# Patient Record
Sex: Male | Born: 1960 | Race: White | Hispanic: No | Marital: Married | Smoking: Current every day smoker
Health system: Southern US, Community
[De-identification: ages and names within clinical notes are randomized; demographics above are authoritative.]

## PROBLEM LIST (undated history)

## (undated) DIAGNOSIS — E785 Hyperlipidemia, unspecified: Secondary | ICD-10-CM

## (undated) DIAGNOSIS — R059 Cough, unspecified: Secondary | ICD-10-CM

## (undated) DIAGNOSIS — H9190 Unspecified hearing loss, unspecified ear: Secondary | ICD-10-CM

## (undated) DIAGNOSIS — G4730 Sleep apnea, unspecified: Secondary | ICD-10-CM

## (undated) DIAGNOSIS — R42 Dizziness and giddiness: Secondary | ICD-10-CM

## (undated) DIAGNOSIS — G709 Myoneural disorder, unspecified: Secondary | ICD-10-CM

## (undated) DIAGNOSIS — E119 Type 2 diabetes mellitus without complications: Secondary | ICD-10-CM

## (undated) DIAGNOSIS — M1990 Unspecified osteoarthritis, unspecified site: Secondary | ICD-10-CM

## (undated) DIAGNOSIS — R05 Cough: Secondary | ICD-10-CM

## (undated) HISTORY — DX: Sleep apnea, unspecified: G47.30

## (undated) HISTORY — DX: Type 2 diabetes mellitus without complications: E11.9

## (undated) HISTORY — DX: Hyperlipidemia, unspecified: E78.5

---

## 1995-10-30 HISTORY — PX: PILONIDAL CYST EXCISION: SHX744

## 2002-09-19 ENCOUNTER — Inpatient Hospital Stay (HOSPITAL_COMMUNITY): Admission: EM | Admit: 2002-09-19 | Discharge: 2002-09-20 | Payer: Self-pay | Admitting: Emergency Medicine

## 2011-07-10 ENCOUNTER — Ambulatory Visit: Payer: Self-pay | Admitting: Unknown Physician Specialty

## 2011-08-16 ENCOUNTER — Ambulatory Visit: Payer: Self-pay | Admitting: Anesthesiology

## 2011-08-22 ENCOUNTER — Ambulatory Visit: Payer: Self-pay | Admitting: Unknown Physician Specialty

## 2011-08-22 HISTORY — PX: SHOULDER SURGERY: SHX246

## 2011-10-30 HISTORY — PX: APPENDECTOMY: SHX54

## 2015-04-21 ENCOUNTER — Encounter: Payer: Self-pay | Admitting: Family Medicine

## 2015-04-21 ENCOUNTER — Ambulatory Visit (INDEPENDENT_AMBULATORY_CARE_PROVIDER_SITE_OTHER): Payer: BC Managed Care – PPO | Admitting: Family Medicine

## 2015-04-21 VITALS — BP 122/80 | HR 74 | Temp 98.1°F | Ht 69.7 in | Wt 222.0 lb

## 2015-04-21 DIAGNOSIS — S90424A Blister (nonthermal), right lesser toe(s), initial encounter: Secondary | ICD-10-CM | POA: Diagnosis not present

## 2015-04-21 DIAGNOSIS — E1140 Type 2 diabetes mellitus with diabetic neuropathy, unspecified: Secondary | ICD-10-CM | POA: Diagnosis not present

## 2015-04-21 DIAGNOSIS — E785 Hyperlipidemia, unspecified: Secondary | ICD-10-CM | POA: Insufficient documentation

## 2015-04-21 LAB — BAYER DCA HB A1C WAIVED: HB A1C (BAYER DCA - WAIVED): 6.2 % (ref <7.0)

## 2015-04-21 MED ORDER — METFORMIN HCL 1000 MG PO TABS: 1000.0000 mg | ORAL_TABLET | Freq: Two times a day (BID) | ORAL | Status: DC

## 2015-04-21 MED ORDER — DAPAGLIFLOZIN PROPANEDIOL 10 MG PO TABS: 10.0000 mg | ORAL_TABLET | Freq: Every day | ORAL | Status: DC

## 2015-04-21 NOTE — Progress Notes (Signed)
   BP 122/80 mmHg  Pulse 74  Temp(Src) 98.1 F (36.7 C)  Ht 5' 9.7" (1.77 m)  Wt 222 lb (100.699 kg)  BMI 32.14 kg/m2  SpO2 96%   Subjective:    Patient ID: Gerald Villegas, male    DOB: 1960/11/03, 54 y.o.   MRN: [id number]  HPI: Gerald Villegas is a 55 y.o. male  Chief Complaint  Patient presents with  . Diabetes  doing much better and 8lb wt loss No side effects from meds farxiga doing well  Concerned with got blister on foot which is healing well with new shoes  Also a gamglion rt 5th toe  Relevant past medical, surgical, family and social history reviewed and updated as indicated. Interim medical history since our last visit reviewed. Allergies and medications reviewed and updated.  Review of Systems  Constitutional: Negative.   Respiratory: Negative.   Cardiovascular: Negative.     Per HPI unless specifically indicated above     Objective:    BP 122/80 mmHg  Pulse 74  Temp(Src) 98.1 F (36.7 C)  Ht 5' 9.7" (1.77 m)  Wt 222 lb (100.699 kg)  BMI 32.14 kg/m2  SpO2 96%  Wt Readings from Last 3 Encounters:  04/21/15 222 lb (100.699 kg)  01/27/15 230 lb (104.327 kg)    Physical Exam  Constitutional: He is oriented to person, place, and time. He appears well-developed and well-nourished. No distress.  HENT:  Head: Normocephalic and atraumatic.  Right Ear: Hearing normal.  Left Ear: Hearing normal.  Nose: Nose normal.  Eyes: Conjunctivae and lids are normal. Right eye exhibits no discharge. Left eye exhibits no discharge. No scleral icterus.  Pulmonary/Chest: Effort normal. No respiratory distress.  Musculoskeletal: Normal range of motion.  Feet clear resolving blister Ganglion stable Feet otherwise stable  Neurological: He is alert and oriented to person, place, and time.  Skin: Skin is intact. No rash noted.  Psychiatric: He has a normal mood and affect. His speech is normal and behavior is normal. Judgment and thought content normal. Cognition and  memory are normal.    No results found for this or any previous visit.    Assessment & Plan:   Problem List Items Addressed This Visit      Endocrine   Type 2 diabetes mellitus with diabetic neuropathy - Primary    The current medical regimen is effective;  continue present plan and medications. Much better with farxiga      Relevant Medications   dapagliflozin propanediol (FARXIGA) 10 MG TABS tablet   metFORMIN (GLUCOPHAGE) 1000 MG tablet   Other Relevant Orders   Bayer DCA Hb A1c Waived    Other Visit Diagnoses    Blister of toe of right foot, initial encounter        stable with no infection        Follow up plan: Return in about 3 months (around 07/22/2015) for a1c in 3 mo.

## 2015-04-21 NOTE — Assessment & Plan Note (Signed)
The current medical regimen is effective;  continue present plan and medications. Much better with farxiga

## 2015-07-27 ENCOUNTER — Encounter: Payer: Self-pay | Admitting: Family Medicine

## 2015-07-27 ENCOUNTER — Ambulatory Visit (INDEPENDENT_AMBULATORY_CARE_PROVIDER_SITE_OTHER): Payer: BC Managed Care – PPO | Admitting: Family Medicine

## 2015-07-27 VITALS — BP 128/68 | HR 56 | Temp 97.5°F | Ht 68.25 in | Wt 222.4 lb

## 2015-07-27 DIAGNOSIS — Z23 Encounter for immunization: Secondary | ICD-10-CM | POA: Diagnosis not present

## 2015-07-27 DIAGNOSIS — G4730 Sleep apnea, unspecified: Secondary | ICD-10-CM | POA: Diagnosis not present

## 2015-07-27 DIAGNOSIS — E785 Hyperlipidemia, unspecified: Secondary | ICD-10-CM | POA: Diagnosis not present

## 2015-07-27 DIAGNOSIS — E1140 Type 2 diabetes mellitus with diabetic neuropathy, unspecified: Secondary | ICD-10-CM | POA: Diagnosis not present

## 2015-07-27 DIAGNOSIS — E119 Type 2 diabetes mellitus without complications: Secondary | ICD-10-CM

## 2015-07-27 NOTE — Assessment & Plan Note (Signed)
The current medical regimen is effective;  continue present plan and medications.  

## 2015-07-27 NOTE — Assessment & Plan Note (Signed)
Discussed dietary treatment and weight loss

## 2015-07-27 NOTE — Progress Notes (Signed)
BP 128/68 mmHg  Pulse 56  Temp(Src) 97.5 F (36.4 C)  Ht 5' 8.25" (1.734 m)  Wt 222 lb 6.4 oz (100.88 kg)  BMI 33.55 kg/m2  SpO2 96%   Subjective:    Patient ID: Gerald Villegas, male    DOB: Jan 13, 1961, 54 y.o.   MRN: [id number]  HPI: Gerald Villegas is a 54 y.o. male  Chief Complaint  Patient presents with  . Follow-up   Patient doing well no complaints from medication no low blood sugar spells takes medicines every day without problems. Patient does relate an episode that sounds like low blood sugar Reviewed nutrition has limited breakfast  Patient with marked afternoon fatigue patient uses his BiPAP faithfully Takes an afternoon nap most afternoons and wakes up refreshed after about 30 minutes. On review patient getting 5-6 hours of sleep at night. Also limited nutrition in the morning.  Relevant past medical, surgical, family and social history reviewed and updated as indicated. Interim medical history since our last visit reviewed. Allergies and medications reviewed and updated.  Review of Systems  Constitutional: Negative.   Respiratory: Negative.   Cardiovascular: Negative.     Per HPI unless specifically indicated above     Objective:    BP 128/68 mmHg  Pulse 56  Temp(Src) 97.5 F (36.4 C)  Ht 5' 8.25" (1.734 m)  Wt 222 lb 6.4 oz (100.88 kg)  BMI 33.55 kg/m2  SpO2 96%  Wt Readings from Last 3 Encounters:  07/27/15 222 lb 6.4 oz (100.88 kg)  04/21/15 222 lb (100.699 kg)  01/27/15 230 lb (104.327 kg)    Physical Exam  Constitutional: He is oriented to person, place, and time. He appears well-developed and well-nourished. No distress.  HENT:  Head: Normocephalic and atraumatic.  Right Ear: Hearing normal.  Left Ear: Hearing normal.  Nose: Nose normal.  Eyes: Conjunctivae and lids are normal. Right eye exhibits no discharge. Left eye exhibits no discharge. No scleral icterus.  Cardiovascular: Normal rate, regular rhythm and normal heart sounds.    Pulmonary/Chest: Effort normal and breath sounds normal. No respiratory distress.  Musculoskeletal: Normal range of motion.  Neurological: He is alert and oriented to person, place, and time.  Skin: Skin is intact. No rash noted.  Psychiatric: He has a normal mood and affect. His speech is normal and behavior is normal. Judgment and thought content normal. Cognition and memory are normal.    Results for orders placed or performed in visit on 04/21/15  Bayer DCA Hb A1c Waived  Result Value Ref Range   Bayer DCA Hb A1c Waived 6.2 <7.0 %      Assessment & Plan:   Problem List Items Addressed This Visit      Endocrine   Type 2 diabetes mellitus with diabetic neuropathy    The current medical regimen is effective;  continue present plan and medications.         Other   Hyperlipidemia    Discussed dietary treatment and weight loss      Sleep apnea    Discussed sleep hygiene for fatigue continual use of BiPAP More sleep hours Also to enjoy occasional nap.       Other Visit Diagnoses    Type 2 diabetes mellitus without complication    -  Primary    Relevant Orders    Glucose Hemocue Waived (STAT)    Bayer DCA Hb A1c Waived    Need for influenza vaccination  Relevant Orders    Flu Vaccine QUAD 36+ mos PF IM (Fluarix & Fluzone Quad PF) (Completed)        Follow up plan: Return in about 3 months (around 10/26/2015) for Physical Exam and a1c.

## 2015-07-27 NOTE — Assessment & Plan Note (Signed)
Discussed sleep hygiene for fatigue continual use of BiPAP More sleep hours Also to enjoy occasional nap.

## 2015-07-28 LAB — BAYER DCA HB A1C WAIVED: HB A1C: 6.4 % (ref <7.0)

## 2015-07-28 LAB — GLUCOSE HEMOCUE WAIVED: Glu Hemocue Waived: 173 mg/dL — ABNORMAL HIGH (ref 65–99)

## 2015-10-31 LAB — HM DIABETES EYE EXAM

## 2015-11-03 ENCOUNTER — Encounter: Payer: Self-pay | Admitting: Family Medicine

## 2015-11-03 ENCOUNTER — Ambulatory Visit (INDEPENDENT_AMBULATORY_CARE_PROVIDER_SITE_OTHER): Payer: BC Managed Care – PPO | Admitting: Family Medicine

## 2015-11-03 VITALS — BP 118/77 | HR 86 | Temp 98.1°F | Ht 69.4 in | Wt 224.0 lb

## 2015-11-03 DIAGNOSIS — Z113 Encounter for screening for infections with a predominantly sexual mode of transmission: Secondary | ICD-10-CM | POA: Diagnosis not present

## 2015-11-03 DIAGNOSIS — G4730 Sleep apnea, unspecified: Secondary | ICD-10-CM | POA: Diagnosis not present

## 2015-11-03 DIAGNOSIS — Z1211 Encounter for screening for malignant neoplasm of colon: Secondary | ICD-10-CM | POA: Diagnosis not present

## 2015-11-03 DIAGNOSIS — Z0000 Encounter for general adult medical examination without abnormal findings: Secondary | ICD-10-CM | POA: Diagnosis not present

## 2015-11-03 DIAGNOSIS — E119 Type 2 diabetes mellitus without complications: Secondary | ICD-10-CM

## 2015-11-03 DIAGNOSIS — E1140 Type 2 diabetes mellitus with diabetic neuropathy, unspecified: Secondary | ICD-10-CM | POA: Diagnosis not present

## 2015-11-03 MED ORDER — METFORMIN HCL 1000 MG PO TABS: 1000.0000 mg | ORAL_TABLET | Freq: Every day | ORAL | Status: DC

## 2015-11-03 MED ORDER — FLUTICASONE PROPIONATE 50 MCG/ACT NA SUSP: 2.0000 | Freq: Every day | NASAL | Status: DC

## 2015-11-03 MED ORDER — DAPAGLIFLOZIN PROPANEDIOL 10 MG PO TABS: 10.0000 mg | ORAL_TABLET | Freq: Every day | ORAL | Status: DC

## 2015-11-03 MED ORDER — VARENICLINE TARTRATE 1 MG PO TABS: 1.0000 mg | ORAL_TABLET | Freq: Two times a day (BID) | ORAL | Status: DC

## 2015-11-03 MED ORDER — VARENICLINE TARTRATE 0.5 MG X 11 & 1 MG X 42 PO MISC: ORAL | Status: DC

## 2015-11-03 NOTE — Assessment & Plan Note (Addendum)
The current medical regimen is effective;  continue present plan and medications. Discussed need for better control patient will obtain with lifestyle changes

## 2015-11-03 NOTE — Assessment & Plan Note (Signed)
Uses CPAP 

## 2015-11-03 NOTE — Progress Notes (Signed)
BP 118/77 mmHg  Pulse 86  Temp(Src) 98.1 F (36.7 C)  Ht 5' 9.4" (1.763 m)  Wt 224 lb (101.606 kg)  BMI 32.69 kg/m2  SpO2 98%   Subjective:    Patient ID: Gerald Villegas, male    DOB: 1961-04-04, 55 y.o.   MRN: [id number]  HPI: Gerald Villegas is a 55 y.o. male  Chief Complaint  Patient presents with  . Annual Exam   She doing well with diabetes no complaints from medications may be having a little more neuropathy symptoms all in all doing well no side effects from medications and noted low blood sugar spells Using CPAP without problems and faithfully  Relevant past medical, surgical, family and social history reviewed and updated as indicated. Interim medical history since our last visit reviewed. Allergies and medications reviewed and updated.  Review of Systems  Constitutional: Negative.   HENT: Negative.   Eyes: Negative.   Respiratory: Negative.   Cardiovascular: Negative.   Gastrointestinal: Negative.   Endocrine: Negative.   Genitourinary: Negative.   Musculoskeletal: Negative.   Skin: Negative.   Allergic/Immunologic: Negative.   Neurological: Negative.   Hematological: Negative.   Psychiatric/Behavioral: Negative.     Per HPI unless specifically indicated above     Objective:    BP 118/77 mmHg  Pulse 86  Temp(Src) 98.1 F (36.7 C)  Ht 5' 9.4" (1.763 m)  Wt 224 lb (101.606 kg)  BMI 32.69 kg/m2  SpO2 98%  Wt Readings from Last 3 Encounters:  11/03/15 224 lb (101.606 kg)  07/27/15 222 lb 6.4 oz (100.88 kg)  04/21/15 222 lb (100.699 kg)    Physical Exam  Constitutional: He is oriented to person, place, and time. He appears well-developed and well-nourished.  HENT:  Head: Normocephalic and atraumatic.  Right Ear: External ear normal.  Left Ear: External ear normal.  Eyes: Conjunctivae and EOM are normal. Pupils are equal, round, and reactive to light.  Neck: Normal range of motion. Neck supple.  Cardiovascular: Normal rate, regular rhythm,  normal heart sounds and intact distal pulses.   Pulmonary/Chest: Effort normal and breath sounds normal.  Abdominal: Soft. Bowel sounds are normal. There is no splenomegaly or hepatomegaly.  Genitourinary: Rectum normal, prostate normal and penis normal.  Musculoskeletal: Normal range of motion.  Neurological: He is alert and oriented to person, place, and time. He has normal reflexes.  Mild neuropathy  Skin: No rash noted. No erythema.  Psychiatric: He has a normal mood and affect. His behavior is normal. Judgment and thought content normal.    Results for orders placed or performed in visit on 07/27/15  Glucose Hemocue Waived (STAT)  Result Value Ref Range   Glu Hemocue Waived 173 (H) 65 - 99 mg/dL  Bayer DCA Hb A1c Waived  Result Value Ref Range   Bayer DCA Hb A1c Waived 6.4 <7.0 %      Assessment & Plan:   Problem List Items Addressed This Visit      Endocrine   Type 2 diabetes mellitus with diabetic neuropathy (HCC)    The current medical regimen is effective;  continue present plan and medications. Discussed need for better control patient will obtain with lifestyle changes       Relevant Medications   dapagliflozin propanediol (FARXIGA) 10 MG TABS tablet   metFORMIN (GLUCOPHAGE) 1000 MG tablet     Other   Sleep apnea    Uses CPAP       Other Visit Diagnoses  Routine general medical examination at a health care facility    -  Primary    Relevant Orders    Urinalysis, Routine w reflex microscopic (not at [facility])    CBC with Differential/Platelet    Comprehensive metabolic panel    Lipid Panel w/o Chol/HDL Ratio    PSA    TSH    Diabetes mellitus without complication (HCC)        Relevant Medications    dapagliflozin propanediol (FARXIGA) 10 MG TABS tablet    metFORMIN (GLUCOPHAGE) 1000 MG tablet    Other Relevant Orders    Bayer DCA Hb A1c Waived    Microalbumin, Urine Waived    Routine screening for STI (sexually transmitted infection)        Relevant  Orders    HIV antibody    Hepatitis C Antibody    Colon cancer screening        Relevant Orders    Ambulatory referral to General Surgery        Follow up plan: Return in about 3 months (around 02/01/2016), or if symptoms worsen or fail to improve, for a1c.

## 2015-11-04 ENCOUNTER — Encounter: Payer: Self-pay | Admitting: Family Medicine

## 2015-11-04 LAB — CBC WITH DIFFERENTIAL/PLATELET
Basophils Absolute: 0.1 10*3/uL (ref 0.0–0.2)
Basos: 1 %
EOS (ABSOLUTE): 0.4 10*3/uL (ref 0.0–0.4)
EOS: 4 %
HEMATOCRIT: 47.4 % (ref 37.5–51.0)
HEMOGLOBIN: 16.6 g/dL (ref 12.6–17.7)
Immature Grans (Abs): 0 10*3/uL (ref 0.0–0.1)
Immature Granulocytes: 0 %
LYMPHS ABS: 3.2 10*3/uL — AB (ref 0.7–3.1)
Lymphs: 30 %
MCH: 31.6 pg (ref 26.6–33.0)
MCHC: 35 g/dL (ref 31.5–35.7)
MCV: 90 fL (ref 79–97)
MONOCYTES: 4 %
Monocytes Absolute: 0.5 10*3/uL (ref 0.1–0.9)
NEUTROS ABS: 6.4 10*3/uL (ref 1.4–7.0)
Neutrophils: 61 %
Platelets: 196 10*3/uL (ref 150–379)
RBC: 5.26 x10E6/uL (ref 4.14–5.80)
RDW: 12.6 % (ref 12.3–15.4)
WBC: 10.6 10*3/uL (ref 3.4–10.8)

## 2015-11-04 LAB — COMPREHENSIVE METABOLIC PANEL
A/G RATIO: 2 (ref 1.1–2.5)
ALBUMIN: 4.6 g/dL (ref 3.5–5.5)
ALK PHOS: 53 IU/L (ref 39–117)
ALT: 17 IU/L (ref 0–44)
AST: 16 IU/L (ref 0–40)
BILIRUBIN TOTAL: 0.4 mg/dL (ref 0.0–1.2)
BUN / CREAT RATIO: 16 (ref 9–20)
BUN: 14 mg/dL (ref 6–24)
CHLORIDE: 99 mmol/L (ref 96–106)
CO2: 24 mmol/L (ref 18–29)
CREATININE: 0.88 mg/dL (ref 0.76–1.27)
Calcium: 9.6 mg/dL (ref 8.7–10.2)
GFR calc Af Amer: 114 mL/min/{1.73_m2} (ref >59)
GFR calc non Af Amer: 99 mL/min/{1.73_m2} (ref >59)
GLOBULIN, TOTAL: 2.3 g/dL (ref 1.5–4.5)
Glucose: 229 mg/dL — ABNORMAL HIGH (ref 65–99)
Potassium: 4.9 mmol/L (ref 3.5–5.2)
SODIUM: 139 mmol/L (ref 134–144)
Total Protein: 6.9 g/dL (ref 6.0–8.5)

## 2015-11-04 LAB — MICROSCOPIC EXAMINATION: WBC, UA: NONE SEEN /hpf (ref 0–?)

## 2015-11-04 LAB — MICROALBUMIN, URINE WAIVED
Creatinine, Urine Waived: 50 mg/dL (ref 10–300)
Microalb, Ur Waived: 10 mg/L (ref 0–19)

## 2015-11-04 LAB — LIPID PANEL W/O CHOL/HDL RATIO
CHOLESTEROL TOTAL: 157 mg/dL (ref 100–199)
HDL: 23 mg/dL — ABNORMAL LOW (ref >39)
Triglycerides: 518 mg/dL — ABNORMAL HIGH (ref 0–149)

## 2015-11-04 LAB — BAYER DCA HB A1C WAIVED: HB A1C: 7.2 % — AB (ref <7.0)

## 2015-11-04 LAB — URINALYSIS, ROUTINE W REFLEX MICROSCOPIC
BILIRUBIN UA: NEGATIVE
Ketones, UA: NEGATIVE
LEUKOCYTES UA: NEGATIVE
Nitrite, UA: NEGATIVE
PH UA: 5.5 (ref 5.0–7.5)
PROTEIN UA: NEGATIVE
Specific Gravity, UA: 1.01 (ref 1.005–1.030)
Urobilinogen, Ur: 0.2 mg/dL (ref 0.2–1.0)

## 2015-11-04 LAB — HEPATITIS C ANTIBODY

## 2015-11-04 LAB — TSH: TSH: 1.51 u[IU]/mL (ref 0.450–4.500)

## 2015-11-04 LAB — PSA: Prostate Specific Ag, Serum: 0.2 ng/mL (ref 0.0–4.0)

## 2015-11-04 LAB — HIV ANTIBODY (ROUTINE TESTING W REFLEX): HIV SCREEN 4TH GENERATION: NONREACTIVE

## 2015-11-08 ENCOUNTER — Other Ambulatory Visit: Payer: Self-pay

## 2015-11-08 ENCOUNTER — Telehealth: Payer: Self-pay

## 2015-11-08 NOTE — Telephone Encounter (Signed)
Gastroenterology Pre-Procedure Review  Request Date: 04/13/16 Requesting Physician: Dr. Jeananne Rama  PATIENT REVIEW QUESTIONS: The patient responded to the following health history questions as indicated:    1. Are you having any GI issues? no 2. Do you have a personal history of Polyps? no 3. Do you have a family history of Colon Cancer or Polyps? yes (mother, polyps) 4. Diabetes Mellitus? yes (Type 2) 5. Joint replacements in the past 12 months?no 6. Major health problems in the past 3 months?no 7. Any artificial heart valves, MVP, or defibrillator?no    MEDICATIONS & ALLERGIES:    Patient reports the following regarding taking any anticoagulation/antiplatelet therapy:   Plavix, Coumadin, Eliquis, Xarelto, Lovenox, Pradaxa, Brilinta, or Effient? no Aspirin? yes (ASA 81mg )  Patient confirms/reports the following medications:  Current Outpatient Prescriptions  Medication Sig Dispense Refill  . aspirin EC 81 MG tablet Take 81 mg by mouth daily.    . dapagliflozin propanediol (FARXIGA) 10 MG TABS tablet Take 10 mg by mouth daily. 90 tablet 4  . fexofenadine (ALLEGRA) 180 MG tablet Take 180 mg by mouth daily.    . fluticasone (FLONASE) 50 MCG/ACT nasal spray Place 2 sprays into both nostrils daily. 16 g 12  . metFORMIN (GLUCOPHAGE) 1000 MG tablet Take 1 tablet (1,000 mg total) by mouth daily. 90 tablet 4  . varenicline (CHANTIX CONTINUING MONTH PAK) 1 MG tablet Take 1 tablet (1 mg total) by mouth 2 (two) times daily. 60 tablet 4  . varenicline (CHANTIX STARTING MONTH PAK) 0.5 MG X 11 & 1 MG X 42 tablet Take one 0.5 mg tablet by mouth once daily for 3 days, then increase to one 0.5 mg tablet twice daily for 4 days, then increase to one 1 mg tablet twice daily. 53 tablet 0   No current facility-administered medications for this visit.    Patient confirms/reports the following allergies:  No Known Allergies  No orders of the defined types were placed in this encounter.    AUTHORIZATION  INFORMATION Primary Insurance: 1D#: Group #:  Secondary Insurance: 1D#: Group #:  SCHEDULE INFORMATION: Date: 04/13/16 Time: Location: [place]

## 2015-12-20 ENCOUNTER — Encounter: Payer: Self-pay | Admitting: Family Medicine

## 2015-12-22 ENCOUNTER — Other Ambulatory Visit: Payer: Self-pay | Admitting: Family Medicine

## 2015-12-22 ENCOUNTER — Telehealth: Payer: Self-pay | Admitting: Family Medicine

## 2015-12-22 MED ORDER — AMOXICILLIN 875 MG PO TABS: 875.0000 mg | ORAL_TABLET | Freq: Two times a day (BID) | ORAL | Status: DC

## 2015-12-22 NOTE — Telephone Encounter (Signed)
rx sent to [facility] 

## 2015-12-22 NOTE — Telephone Encounter (Signed)
Pt called wanting to know if he could get antibiotics called in.  He feels he has a sinus infection.  Please call patient to advise.

## 2015-12-22 NOTE — Progress Notes (Signed)
Sinusitis rx 

## 2016-02-02 ENCOUNTER — Ambulatory Visit: Payer: BC Managed Care – PPO | Admitting: Family Medicine

## 2016-02-06 ENCOUNTER — Encounter: Payer: Self-pay | Admitting: Family Medicine

## 2016-02-06 ENCOUNTER — Ambulatory Visit (INDEPENDENT_AMBULATORY_CARE_PROVIDER_SITE_OTHER): Payer: BC Managed Care – PPO | Admitting: Family Medicine

## 2016-02-06 VITALS — BP 137/82 | HR 75 | Temp 97.9°F | Ht 69.6 in | Wt 225.0 lb

## 2016-02-06 DIAGNOSIS — E1140 Type 2 diabetes mellitus with diabetic neuropathy, unspecified: Secondary | ICD-10-CM | POA: Diagnosis not present

## 2016-02-06 DIAGNOSIS — E785 Hyperlipidemia, unspecified: Secondary | ICD-10-CM | POA: Diagnosis not present

## 2016-02-06 LAB — BAYER DCA HB A1C WAIVED: HB A1C (BAYER DCA - WAIVED): 6.7 % (ref <7.0)

## 2016-02-06 NOTE — Assessment & Plan Note (Signed)
The current medical regimen is effective;  continue present plan and medications.  

## 2016-02-06 NOTE — Progress Notes (Signed)
   BP 137/82 mmHg  Pulse 75  Temp(Src) 97.9 F (36.6 C)  Ht 5' 9.6" (1.768 m)  Wt 225 lb (102.059 kg)  BMI 32.65 kg/m2  SpO2 99%   Subjective:    Patient ID: Gerald Villegas, male    DOB: 04/06/1961, 55 y.o.   MRN: [id number]  HPI: Gerald Villegas is a 55 y.o. male  Chief Complaint  Patient presents with  . Diabetes  Patient doing well with diabetes no complaints from medications noted low blood sugar spells no side effects takes faithfully without problems. Chantix in the first month or so has been cutting back and is doing very well wants to keep taking as he feels better with this medication. Has some questions about aspartame as has some joint aches and concerned on review does drink a lot of aspartame. Allergies doing okay  Relevant past medical, surgical, family and social history reviewed and updated as indicated. Interim medical history since our last visit reviewed. Allergies and medications reviewed and updated.  Review of Systems  Constitutional: Negative.   Respiratory: Negative.   Cardiovascular: Negative.     Per HPI unless specifically indicated above     Objective:    BP 137/82 mmHg  Pulse 75  Temp(Src) 97.9 F (36.6 C)  Ht 5' 9.6" (1.768 m)  Wt 225 lb (102.059 kg)  BMI 32.65 kg/m2  SpO2 99%  Wt Readings from Last 3 Encounters:  02/06/16 225 lb (102.059 kg)  11/03/15 224 lb (101.606 kg)  07/27/15 222 lb 6.4 oz (100.88 kg)    Physical Exam  Constitutional: He is oriented to person, place, and time. He appears well-developed and well-nourished. No distress.  HENT:  Head: Normocephalic and atraumatic.  Right Ear: Hearing normal.  Left Ear: Hearing normal.  Nose: Nose normal.  Eyes: Conjunctivae and lids are normal. Right eye exhibits no discharge. Left eye exhibits no discharge. No scleral icterus.  Cardiovascular: Normal rate, regular rhythm and normal heart sounds.   Pulmonary/Chest: Effort normal and breath sounds normal. No respiratory  distress.  Musculoskeletal: Normal range of motion.  Neurological: He is alert and oriented to person, place, and time.  Skin: Skin is intact. No rash noted.  Psychiatric: He has a normal mood and affect. His speech is normal and behavior is normal. Judgment and thought content normal. Cognition and memory are normal.    Results for orders placed or performed in visit on 11/16/15  HM DIABETES EYE EXAM  Result Value Ref Range   HM Diabetic Eye Exam No Retinopathy No Retinopathy      Assessment & Plan:   Problem List Items Addressed This Visit      Endocrine   Type 2 diabetes mellitus with diabetic neuropathy ([place]) - Primary    The current medical regimen is effective;  continue present plan and medications. Discuss use of aspartame cutting back is a trial to see if that is affecting his joints and arthralgias. Discussed alternatives       Relevant Orders   Bayer DCA Hb A1c Waived     Other   Hyperlipidemia    The current medical regimen is effective;  continue present plan and medications.           Follow up plan: Return in about 3 months (around 05/07/2016) for BMP, lipids, ALT, AST, A1c.

## 2016-02-06 NOTE — Assessment & Plan Note (Addendum)
The current medical regimen is effective;  continue present plan and medications. Discuss use of aspartame cutting back is a trial to see if that is affecting his joints and arthralgias. Discussed alternatives

## 2016-03-14 ENCOUNTER — Encounter: Payer: Self-pay | Admitting: Unknown Physician Specialty

## 2016-03-14 ENCOUNTER — Ambulatory Visit (INDEPENDENT_AMBULATORY_CARE_PROVIDER_SITE_OTHER): Payer: BC Managed Care – PPO | Admitting: Unknown Physician Specialty

## 2016-03-14 VITALS — BP 143/85 | HR 88 | Temp 98.1°F | Ht 69.6 in | Wt 227.1 lb

## 2016-03-14 DIAGNOSIS — J029 Acute pharyngitis, unspecified: Secondary | ICD-10-CM | POA: Diagnosis not present

## 2016-03-14 MED ORDER — AZITHROMYCIN 250 MG PO TABS: ORAL_TABLET | ORAL | Status: DC

## 2016-03-14 NOTE — Progress Notes (Signed)
BP 143/85 mmHg  Pulse 88  Temp(Src) 98.1 F (36.7 C)  Ht 5' 9.6" (1.768 m)  Wt 227 lb 1.6 oz (103.012 kg)  BMI 32.96 kg/m2  SpO2 99%   Subjective:    Patient ID: Gerald Villegas, male    DOB: 1961/05/28, 55 y.o.   MRN: [id number]  HPI: Gerald Villegas is a 55 y.o. male  Chief Complaint  Patient presents with  . Sore Throat    pt states he has had a sore throat since Sunday. States he has also had a cough and a little chest congestion this morning   Sore Throat  This is a new problem. Episode onset: 3 days. The problem has been gradually improving. There has been no fever. Associated symptoms include coughing and headaches. Pertinent negatives include no shortness of breath or trouble swallowing. Associated symptoms comments: Chills body aches. He has tried nothing for the symptoms. The treatment provided no relief.  3 students out with strep  Relevant past medical, surgical, family and social history reviewed and updated as indicated. Interim medical history since our last visit reviewed. Allergies and medications reviewed and updated.  Review of Systems  HENT: Negative for trouble swallowing.   Respiratory: Positive for cough. Negative for shortness of breath.   Neurological: Positive for headaches.    Per HPI unless specifically indicated above     Objective:    BP 143/85 mmHg  Pulse 88  Temp(Src) 98.1 F (36.7 C)  Ht 5' 9.6" (1.768 m)  Wt 227 lb 1.6 oz (103.012 kg)  BMI 32.96 kg/m2  SpO2 99%  Wt Readings from Last 3 Encounters:  03/14/16 227 lb 1.6 oz (103.012 kg)  02/06/16 225 lb (102.059 kg)  11/03/15 224 lb (101.606 kg)    Physical Exam  Constitutional: He is oriented to person, place, and time. He appears well-developed and well-nourished. No distress.  HENT:  Head: Normocephalic and atraumatic.  Right Ear: Tympanic membrane and ear canal normal.  Left Ear: Tympanic membrane and ear canal normal.  Nose: Rhinorrhea present. Right sinus exhibits no  maxillary sinus tenderness and no frontal sinus tenderness. Left sinus exhibits no maxillary sinus tenderness and no frontal sinus tenderness.  Mouth/Throat: Uvula is midline. Posterior oropharyngeal edema and posterior oropharyngeal erythema present.  Eyes: Conjunctivae and lids are normal. Right eye exhibits no discharge. Left eye exhibits no discharge. No scleral icterus.  Neck: Neck supple.  Cardiovascular: Normal rate, regular rhythm and normal heart sounds.   Pulmonary/Chest: Effort normal and breath sounds normal. No respiratory distress.  Abdominal: Normal appearance. There is no splenomegaly or hepatomegaly.  Musculoskeletal: Normal range of motion.  Neurological: He is alert and oriented to person, place, and time.  Skin: Skin is warm, dry and intact. No rash noted. No pallor.  Psychiatric: He has a normal mood and affect. His behavior is normal. Judgment and thought content normal.  Nursing note and vitals reviewed.   Results for orders placed or performed in visit on 02/06/16  Bayer DCA Hb A1c Waived  Result Value Ref Range   Bayer DCA Hb A1c Waived 6.7 <7.0 %      Assessment & Plan:   Problem List Items Addressed This Visit    None    Visit Diagnoses    Sore throat    -  Primary    Relevant Orders    Rapid strep screen (not at [facility])       Strep is negative but clinically suspicious with exposure.  Rx for Z pack  Follow up plan: Return if symptoms worsen or fail to improve.

## 2016-03-17 LAB — CULTURE, GROUP A STREP: Strep A Culture: NEGATIVE

## 2016-03-17 LAB — RAPID STREP SCREEN (MED CTR MEBANE ONLY): Strep Gp A Ag, IA W/Reflex: NEGATIVE

## 2016-04-19 NOTE — Discharge Instructions (Signed)

## 2016-04-20 ENCOUNTER — Ambulatory Visit: Payer: BC Managed Care – PPO | Admitting: Anesthesiology

## 2016-04-20 ENCOUNTER — Ambulatory Visit
Admission: RE | Admit: 2016-04-20 | Discharge: 2016-04-20 | Disposition: A | Discharge status: Home / Self Care | Payer: BC Managed Care – PPO | Source: Ambulatory Visit | Attending: Gastroenterology | Admitting: Gastroenterology

## 2016-04-20 ENCOUNTER — Encounter
Admission: RE | Disposition: A | Discharge status: Home / Self Care | Payer: Self-pay | Source: Ambulatory Visit | Attending: Gastroenterology

## 2016-04-20 DIAGNOSIS — Z79899 Other long term (current) drug therapy: Secondary | ICD-10-CM | POA: Insufficient documentation

## 2016-04-20 DIAGNOSIS — M19041 Primary osteoarthritis, right hand: Secondary | ICD-10-CM | POA: Diagnosis not present

## 2016-04-20 DIAGNOSIS — M19042 Primary osteoarthritis, left hand: Secondary | ICD-10-CM | POA: Diagnosis not present

## 2016-04-20 DIAGNOSIS — D123 Benign neoplasm of transverse colon: Secondary | ICD-10-CM | POA: Diagnosis not present

## 2016-04-20 DIAGNOSIS — Z1211 Encounter for screening for malignant neoplasm of colon: Secondary | ICD-10-CM | POA: Diagnosis not present

## 2016-04-20 DIAGNOSIS — Z7982 Long term (current) use of aspirin: Secondary | ICD-10-CM | POA: Insufficient documentation

## 2016-04-20 DIAGNOSIS — K640 First degree hemorrhoids: Secondary | ICD-10-CM | POA: Diagnosis not present

## 2016-04-20 DIAGNOSIS — H9193 Unspecified hearing loss, bilateral: Secondary | ICD-10-CM | POA: Insufficient documentation

## 2016-04-20 DIAGNOSIS — M170 Bilateral primary osteoarthritis of knee: Secondary | ICD-10-CM | POA: Diagnosis not present

## 2016-04-20 DIAGNOSIS — E119 Type 2 diabetes mellitus without complications: Secondary | ICD-10-CM | POA: Insufficient documentation

## 2016-04-20 DIAGNOSIS — E785 Hyperlipidemia, unspecified: Secondary | ICD-10-CM | POA: Diagnosis not present

## 2016-04-20 DIAGNOSIS — G4730 Sleep apnea, unspecified: Secondary | ICD-10-CM | POA: Insufficient documentation

## 2016-04-20 DIAGNOSIS — G629 Polyneuropathy, unspecified: Secondary | ICD-10-CM | POA: Insufficient documentation

## 2016-04-20 DIAGNOSIS — Z7984 Long term (current) use of oral hypoglycemic drugs: Secondary | ICD-10-CM | POA: Insufficient documentation

## 2016-04-20 HISTORY — DX: Cough: R05

## 2016-04-20 HISTORY — DX: Myoneural disorder, unspecified: G70.9

## 2016-04-20 HISTORY — DX: Unspecified osteoarthritis, unspecified site: M19.90

## 2016-04-20 HISTORY — PX: COLONOSCOPY WITH PROPOFOL: SHX5780

## 2016-04-20 HISTORY — DX: Dizziness and giddiness: R42

## 2016-04-20 HISTORY — DX: Unspecified hearing loss, unspecified ear: H91.90

## 2016-04-20 HISTORY — PX: POLYPECTOMY: SHX5525

## 2016-04-20 HISTORY — DX: Cough, unspecified: R05.9

## 2016-04-20 LAB — GLUCOSE, CAPILLARY: Glucose-Capillary: 154 mg/dL — ABNORMAL HIGH (ref 65–99)

## 2016-04-20 SURGERY — COLONOSCOPY WITH PROPOFOL
Anesthesia: Monitor Anesthesia Care | Wound class: Contaminated

## 2016-04-20 MED ORDER — ACETAMINOPHEN 160 MG/5ML PO SOLN: 325.0000 mg | ORAL | Status: DC | PRN

## 2016-04-20 MED ORDER — LACTATED RINGERS IV SOLN
INTRAVENOUS | Status: DC
  Administered 2016-04-20 (×2): via INTRAVENOUS

## 2016-04-20 MED ORDER — SIMETHICONE 40 MG/0.6ML PO SUSP
ORAL | Status: DC | PRN
  Administered 2016-04-20: 09:00:00

## 2016-04-20 MED ORDER — ACETAMINOPHEN 325 MG PO TABS: 325.0000 mg | ORAL_TABLET | ORAL | Status: DC | PRN

## 2016-04-20 MED ORDER — LIDOCAINE HCL (CARDIAC) 20 MG/ML IV SOLN
INTRAVENOUS | Status: DC | PRN
Start: 2016-04-20 — End: 2016-04-20
  Administered 2016-04-20: 40 mg via INTRAVENOUS

## 2016-04-20 MED ORDER — PROPOFOL 10 MG/ML IV BOLUS
INTRAVENOUS | Status: DC | PRN
  Administered 2016-04-20: 30 mg via INTRAVENOUS
  Administered 2016-04-20: 100 mg via INTRAVENOUS
  Administered 2016-04-20: 30 mg via INTRAVENOUS
  Administered 2016-04-20 (×2): 20 mg via INTRAVENOUS

## 2016-04-20 SURGICAL SUPPLY — 23 items
CANISTER SUCT 1200ML W/VALVE (MISCELLANEOUS) ×4 IMPLANT
CLIP HMST 235XBRD CATH ROT (MISCELLANEOUS) IMPLANT
CLIP RESOLUTION 360 11X235 (MISCELLANEOUS)
FCP ESCP3.2XJMB 240X2.8X (MISCELLANEOUS)
FORCEPS BIOP RAD 4 LRG CAP 4 (CUTTING FORCEPS) ×4 IMPLANT
FORCEPS BIOP RJ4 240 W/NDL (MISCELLANEOUS)
FORCEPS ESCP3.2XJMB 240X2.8X (MISCELLANEOUS) IMPLANT
GOWN CVR UNV OPN BCK APRN NK (MISCELLANEOUS) ×4 IMPLANT
GOWN ISOL THUMB LOOP REG UNIV (MISCELLANEOUS) ×4
INJECTOR VARIJECT VIN23 (MISCELLANEOUS) IMPLANT
KIT DEFENDO VALVE AND CONN (KITS) IMPLANT
KIT ENDO PROCEDURE OLY (KITS) ×4 IMPLANT
MARKER SPOT ENDO TATTOO 5ML (MISCELLANEOUS) IMPLANT
PAD GROUND ADULT SPLIT (MISCELLANEOUS) IMPLANT
PROBE APC STR FIRE (PROBE) IMPLANT
RETRIEVER NET ROTH 2.5X230 LF (MISCELLANEOUS) ×4 IMPLANT
SNARE SHORT THROW 13M SML OVAL (MISCELLANEOUS) IMPLANT
SNARE SHORT THROW 30M LRG OVAL (MISCELLANEOUS) IMPLANT
SNARE SNG USE RND 15MM (INSTRUMENTS) IMPLANT
SPOT EX ENDOSCOPIC TATTOO (MISCELLANEOUS)
TRAP ETRAP POLY (MISCELLANEOUS) IMPLANT
VARIJECT INJECTOR VIN23 (MISCELLANEOUS)
WATER STERILE IRR 250ML POUR (IV SOLUTION) ×4 IMPLANT

## 2016-04-20 NOTE — Anesthesia Preprocedure Evaluation (Signed)
Anesthesia Evaluation  Patient identified by MRN, date of birth, ID band  Reviewed: Allergy & Precautions, H&P , NPO status , Patient's Chart, lab work & pertinent test results  Airway Mallampati: III  TM Distance: >3 FB Neck ROM: full    Dental no notable dental hx.    Pulmonary sleep apnea and Continuous Positive Airway Pressure Ventilation , Current Smoker,    Pulmonary exam normal        Cardiovascular  Rhythm:regular Rate:Normal     Neuro/Psych    GI/Hepatic   Endo/Other  diabetes  Renal/GU      Musculoskeletal   Abdominal   Peds  Hematology   Anesthesia Other Findings   Reproductive/Obstetrics                             Anesthesia Physical Anesthesia Plan  ASA: II  Anesthesia Plan: MAC   Post-op Pain Management:    Induction:   Airway Management Planned:   Additional Equipment:   Intra-op Plan:   Post-operative Plan:   Informed Consent: I have reviewed the patients History and Physical, chart, labs and discussed the procedure including the risks, benefits and alternatives for the proposed anesthesia with the patient or authorized representative who has indicated his/her understanding and acceptance.     Plan Discussed with: CRNA  Anesthesia Plan Comments:         Anesthesia Quick Evaluation

## 2016-04-20 NOTE — H&P (Signed)
Gerald Lame, MD Gerald Villegas [address], [place], [place] [address] Phone: [phone] Fax : [phone]  Primary Care Physician:  Gerald Pop, MD Primary Gastroenterologist:  Dr. Allen Villegas  Pre-Procedure History & Physical: HPI:  Gerald Villegas is a 55 y.o. male is here for a screening colonoscopy.   Past Medical History  Diagnosis Date  . Hyperlipidemia   . Cough     chest congestion for pasr month,zpack, recovering  . Sleep apnea     bi-pap for 14 yrs  . Diabetes mellitus without complication ([place])     type 2  . Neuromuscular disorder (HCC)     neuropathy feet  . Arthritis     knuckles/knees  . HOH (hard of hearing)   . Vertigo     hx of 6 yrs ago/ fast movements    Past Surgical History  Procedure Laterality Date  . Shoulder surgery Right 08/22/11    Dr. Jefm Villegas  . Pilonidal cyst excision  1997  . Appendectomy  2013    Prior to Admission medications   Medication Sig Start Date End Date Taking? Authorizing Provider  aspirin EC 81 MG tablet Take 81 mg by mouth daily. pm   Yes Historical Provider, MD  azithromycin (ZITHROMAX) 250 MG tablet As directed Patient not taking: Reported on 04/17/2016 03/14/16   Kathrine Haddock, NP  dapagliflozin propanediol (FARXIGA) 10 MG TABS tablet Take 10 mg by mouth daily. Patient taking differently: Take 10 mg by mouth daily. am 11/03/15   Guadalupe Maple, MD  fexofenadine (ALLEGRA) 180 MG tablet Take 180 mg by mouth daily. am    Historical Provider, MD  fluticasone (FLONASE) 50 MCG/ACT nasal spray Place 2 sprays into both nostrils daily. Patient taking differently: Place 2 sprays into both nostrils daily. am 11/03/15   Guadalupe Maple, MD  metFORMIN (GLUCOPHAGE) 1000 MG tablet Take 1 tablet (1,000 mg total) by mouth daily. Patient taking differently: Take 1,000 mg by mouth daily. Evening 11/03/15   Guadalupe Maple, MD  varenicline (CHANTIX CONTINUING MONTH PAK) 1 MG tablet Take 1 tablet (1 mg total) by mouth 2 (two) times daily. Patient  taking differently: Take 1 mg by mouth 2 (two) times daily. Am and pm 11/03/15   Guadalupe Maple, MD    Allergies as of 11/08/2015  . (No Known Allergies)    Family History  Problem Relation Age of Onset  . Heart disease Father   . Diabetes Father   . Hypertension Father   . Cancer Paternal Uncle   . Diabetes Maternal Grandfather   . Heart disease Paternal Grandfather     Social History   Social History  . Marital Status: Married    Spouse Name: Gerald Villegas  . Number of Children: Gerald Villegas  . Years of Education: Gerald Villegas   Occupational History  . Not on file.   Social History Main Topics  . Smoking status: Current Every Day Smoker -- 2.00 packs/day for 35 years    Types: Cigarettes, E-cigarettes  . Smokeless tobacco: Never Used  . Alcohol Use: No  . Drug Use: No  . Sexual Activity: Not on file   Other Topics Concern  . Not on file   Social History Narrative    Review of Systems: See HPI, otherwise negative ROS  Physical Exam: BP 122/66 mmHg  Pulse 77  Temp(Src) 97.9 F (36.6 C) (Temporal)  Ht 5' 9.6" (1.768 m)  Wt 221 lb (100.245 kg)  BMI 32.07 kg/m2  SpO2 98% General:  Alert,  pleasant and cooperative in NAD Head:  Normocephalic and atraumatic. Neck:  Supple; no masses or thyromegaly. Lungs:  Clear throughout to auscultation.    Heart:  Regular rate and rhythm. Abdomen:  Soft, nontender and nondistended. Normal bowel sounds, without guarding, and without rebound.   Neurologic:  Alert and  oriented x4;  grossly normal neurologically.  Impression/Plan: Gerald Villegas is now here to undergo a screening colonoscopy.  Risks, benefits, and alternatives regarding colonoscopy have been reviewed with the patient.  Questions have been answered.  All parties agreeable.

## 2016-04-20 NOTE — Anesthesia Postprocedure Evaluation (Signed)
Anesthesia Post Note  Patient: Gerald Villegas  Procedure(s) Performed: Procedure(s) (LRB): COLONOSCOPY WITH PROPOFOL (N/A) POLYPECTOMY  Patient location during evaluation: PACU Anesthesia Type: MAC Level of consciousness: awake and alert and oriented Pain management: satisfactory to patient Vital Signs Assessment: post-procedure vital signs reviewed and stable Respiratory status: spontaneous breathing, nonlabored ventilation and respiratory function stable Cardiovascular status: blood pressure returned to baseline and stable Postop Assessment: Adequate PO intake and No signs of nausea or vomiting Anesthetic complications: no    Raliegh Ip

## 2016-04-20 NOTE — Op Note (Signed)
[facility] Patient Name: Gerald Villegas Procedure Date: 04/20/2016 8:38 AM MRN: [id number] Account #: [id number] Date of Birth: [birth year] Admit Type: Outpatient Age: 55 Room: [facility] OR ROOM 01 Gender: Male Note Status: Finalized Procedure:            Colonoscopy Indications:          Screening for colorectal malignant neoplasm Providers:            Lucilla Lame, MD Referring MD:         Guadalupe Maple, MD (Referring MD) Medicines:            Propofol per Anesthesia Complications:        No immediate complications. Procedure:            Pre-Anesthesia Assessment:                       - Prior to the procedure, a History and Physical was                        performed, and patient medications and allergies were                        reviewed. The patient's tolerance of previous                        anesthesia was also reviewed. The risks and benefits of                        the procedure and the sedation options and risks were                        discussed with the patient. All questions were                        answered, and informed consent was obtained. Prior                        Anticoagulants: The patient has taken no previous                        anticoagulant or antiplatelet agents. ASA Grade                        Assessment: II - A patient with mild systemic disease.                        After reviewing the risks and benefits, the patient was                        deemed in satisfactory condition to undergo the                        procedure.                       After obtaining informed consent, the colonoscope was                        passed under direct vision. Throughout the procedure,  the patient's blood pressure, pulse, and oxygen                        saturations were monitored continuously. The Olympus CF                        H180AL colonoscope (S#: [id number]) was introduced through                        the anus and advanced to the the cecum, identified by                        appendiceal orifice and ileocecal valve. The                        colonoscopy was performed without difficulty. The                        patient tolerated the procedure well. The quality of                        the bowel preparation was excellent. Findings:      The perianal and digital rectal examinations were normal.      Two sessile polyps were found in the transverse colon. The polyps were 2       to 4 mm in size. These polyps were removed with a cold biopsy forceps.       Resection and retrieval were complete.      Non-bleeding internal hemorrhoids were found during retroflexion. The       hemorrhoids were Grade I (internal hemorrhoids that do not prolapse). Impression:           - Two 2 to 4 mm polyps in the transverse colon, removed                        with a cold biopsy forceps. Resected and retrieved.                       - Non-bleeding internal hemorrhoids. Recommendation:       - Await pathology results. Procedure Code(s):    --- Professional ---                       [phone], Colonoscopy, flexible; with biopsy, single or                        multiple Diagnosis Code(s):    --- Professional ---                       Z12.11, Encounter for screening for malignant neoplasm                        of colon                       D12.3, Benign neoplasm of transverse colon (hepatic                        flexure or splenic flexure) CPT copyright 2016 American Medical Association. All rights reserved. The codes documented in this report are preliminary and upon coder review  may  be revised to meet current compliance requirements. Lucilla Lame, MD 04/20/2016 9:00:51 AM This report has been signed electronically. Number of Addenda: 0 Note Initiated On: 04/20/2016 8:38 AM Scope Withdrawal Time: 0 hours 5 minutes 41 seconds  Total Procedure Duration: 0 hours 6 minutes 56 seconds        [place]

## 2016-04-20 NOTE — Transfer of Care (Signed)
Immediate Anesthesia Transfer of Care Note  Patient: Gerald Villegas  Procedure(s) Performed: Procedure(s) with comments: COLONOSCOPY WITH PROPOFOL (N/A) - Diabetic-oral med POLYPECTOMY  Patient Location: PACU  Anesthesia Type: MAC  Level of Consciousness: awake, alert  and patient cooperative  Airway and Oxygen Therapy: Patient Spontanous Breathing and Patient connected to supplemental oxygen  Post-op Assessment: Post-op Vital signs reviewed, Patient's Cardiovascular Status Stable, Respiratory Function Stable, Patent Airway and No signs of Nausea or vomiting  Post-op Vital Signs: Reviewed and stable  Complications: No apparent anesthesia complications

## 2016-04-20 NOTE — Anesthesia Procedure Notes (Signed)
Procedure Name: MAC Performed by: Dalana Pfahler Pre-anesthesia Checklist: Patient identified, Emergency Drugs available, Suction available, Patient being monitored and Timeout performed Patient Re-evaluated:Patient Re-evaluated prior to inductionOxygen Delivery Method: Nasal cannula       

## 2016-04-23 ENCOUNTER — Encounter: Payer: Self-pay | Admitting: Gastroenterology

## 2016-05-23 ENCOUNTER — Ambulatory Visit: Payer: BC Managed Care – PPO | Admitting: Family Medicine

## 2016-05-28 ENCOUNTER — Encounter: Payer: Self-pay | Admitting: Family Medicine

## 2016-05-28 ENCOUNTER — Ambulatory Visit (INDEPENDENT_AMBULATORY_CARE_PROVIDER_SITE_OTHER): Payer: BC Managed Care – PPO | Admitting: Family Medicine

## 2016-05-28 VITALS — BP 136/85 | HR 75 | Temp 98.0°F | Ht 70.2 in | Wt 224.0 lb

## 2016-05-28 DIAGNOSIS — E785 Hyperlipidemia, unspecified: Secondary | ICD-10-CM

## 2016-05-28 DIAGNOSIS — E1140 Type 2 diabetes mellitus with diabetic neuropathy, unspecified: Secondary | ICD-10-CM | POA: Diagnosis not present

## 2016-05-28 DIAGNOSIS — R079 Chest pain, unspecified: Secondary | ICD-10-CM | POA: Diagnosis not present

## 2016-05-28 NOTE — Assessment & Plan Note (Signed)
Due to patient's angina symptoms and multiple risk factors of diabetes hyper triglycerides smoker and family history refer to cardiology to further evaluate.

## 2016-05-28 NOTE — Progress Notes (Signed)
BP 136/85 (BP Location: Left Arm, Patient Position: Sitting, Cuff Size: Normal)   Pulse 75   Temp 98 F (36.7 C)   Ht 5' 10.2" (1.783 m)   Wt 224 lb (101.6 kg)   SpO2 99%   BMI 31.96 kg/m    Subjective:    Patient ID: Gerald Villegas, male    DOB: 05-Jun-1961, 55 y.o.   MRN: [id number]  HPI: ANSIL Villegas is a 55 y.o. male  Chief Complaint  Patient presents with  . Diabetes  . Hyperlipidemia  Patient follow-up diabetes no complaints from medications noted low blood sugar spells. Has been on vacation and liberalize his diet on vacation but otherwise is been doing well. Patient has had elevated triglycerides in the past but no other symptoms.  On further review of patient's triglycerides patient has had exercise-induced shortness of breath and some chest tightness. 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  Risk factors of diabetes elevated triglycerides, smoker, and strong family history.   Relevant past medical, surgical, family and social history reviewed and updated as indicated. Interim medical history since our last visit reviewed. Allergies and medications reviewed and updated.  Review of Systems  Constitutional: Negative.   Respiratory: Negative.   Cardiovascular: Positive for chest pain. Negative for palpitations and leg swelling.    Per HPI unless specifically indicated above     Objective:    BP 136/85 (BP Location: Left Arm, Patient Position: Sitting, Cuff Size: Normal)   Pulse 75   Temp 98 F (36.7 C)   Ht 5' 10.2" (1.783 m)   Wt 224 lb (101.6 kg)   SpO2 99%   BMI 31.96 kg/m   Wt Readings from Last 3 Encounters:  05/28/16 224 lb (101.6 kg)  04/20/16 221 lb (100.2 kg)  03/14/16 227 lb 1.6 oz (103 kg)    Physical Exam  Constitutional: He is oriented to person, place, and time. He appears well-developed and well-nourished. No distress.  HENT:    Head: Normocephalic and atraumatic.  Right Ear: Hearing normal.  Left Ear: Hearing normal.  Nose: Nose normal.  Eyes: Conjunctivae and lids are normal. Right eye exhibits no discharge. Left eye exhibits no discharge. No scleral icterus.  Cardiovascular: Normal rate, regular rhythm and normal heart sounds.   Pulmonary/Chest: Effort normal and breath sounds normal. No respiratory distress.  Musculoskeletal: Normal range of motion.  Neurological: He is alert and oriented to person, place, and time.  Skin: Skin is intact. No rash noted.  Psychiatric: He has a normal mood and affect. His speech is normal and behavior is normal. Judgment and thought content normal. Cognition and memory are normal.    Results for orders placed or performed during the hospital encounter of 04/20/16  Glucose, capillary  Result Value Ref Range   Glucose-Capillary 154 (H) 65 - 99 mg/dL     normal EKG no acute changes  Assessment & Plan:   Problem List Items Addressed This Visit      Endocrine   Type 2 diabetes mellitus with diabetic neuropathy ([place]) - Primary    The current medical regimen is effective;  continue present plan and medications.       Relevant Orders   Bayer DCA Hb A1c Waived   LP+ALT+AST Piccolo, Waived   Basic metabolic panel     Other   Hyperlipidemia    Results pending his blood work lipenic and  panel sent out to [facility]      Relevant Orders   Bayer [place] Hb A1c Waived   LP+ALT+AST Piccolo, [place]   Basic metabolic panel   Chest pain on exertion    Due to patient's angina symptoms and multiple risk factors of diabetes hyper triglycerides smoker and family history refer to cardiology to further evaluate.      Relevant Orders   EKG 12-Lead (Completed)   Ambulatory referral to Cardiology    Other Visit Diagnoses   None.     Still encouraged to quit smoking again  Follow up plan: Return in about 3 months (around 08/28/2016) for a1c.

## 2016-05-28 NOTE — Assessment & Plan Note (Signed)
The current medical regimen is effective;  continue present plan and medications.  

## 2016-05-28 NOTE — Assessment & Plan Note (Signed)
Results pending his blood work lipenic and panel sent out to [facility]

## 2016-05-29 LAB — BASIC METABOLIC PANEL
BUN / CREAT RATIO: 14 (ref 9–20)
BUN: 12 mg/dL (ref 6–24)
CO2: 20 mmol/L (ref 18–29)
CREATININE: 0.87 mg/dL (ref 0.76–1.27)
Calcium: 9.4 mg/dL (ref 8.7–10.2)
Chloride: 101 mmol/L (ref 96–106)
GFR, EST AFRICAN AMERICAN: 115 mL/min/{1.73_m2} (ref >59)
GFR, EST NON AFRICAN AMERICAN: 99 mL/min/{1.73_m2} (ref >59)
Glucose: 155 mg/dL — ABNORMAL HIGH (ref 65–99)
Potassium: 4.7 mmol/L (ref 3.5–5.2)
SODIUM: 138 mmol/L (ref 134–144)

## 2016-05-30 LAB — LIPID PANEL W/O CHOL/HDL RATIO
Cholesterol, Total: 146 mg/dL (ref 100–199)
HDL: 26 mg/dL — AB (ref >39)
LDL Calculated: 41 mg/dL (ref 0–99)
Triglycerides: 395 mg/dL — ABNORMAL HIGH (ref 0–149)
VLDL Cholesterol Cal: 79 mg/dL — ABNORMAL HIGH (ref 5–40)

## 2016-05-30 LAB — LP+ALT+AST PICCOLO, WAIVED
ALT (SGPT) PICCOLO, WAIVED: 21 U/L (ref 10–47)
AST (SGOT) PICCOLO, WAIVED: 28 U/L (ref 11–38)

## 2016-05-30 LAB — SPECIMEN STATUS REPORT

## 2016-05-30 LAB — BAYER DCA HB A1C WAIVED: HB A1C (BAYER DCA - WAIVED): 6.9 % (ref <7.0)

## 2016-09-05 ENCOUNTER — Encounter: Payer: Self-pay | Admitting: Family Medicine

## 2016-09-05 ENCOUNTER — Ambulatory Visit (INDEPENDENT_AMBULATORY_CARE_PROVIDER_SITE_OTHER): Payer: BC Managed Care – PPO | Admitting: Family Medicine

## 2016-09-05 VITALS — BP 139/89 | HR 76 | Temp 97.8°F | Wt 222.1 lb

## 2016-09-05 DIAGNOSIS — Z23 Encounter for immunization: Secondary | ICD-10-CM

## 2016-09-05 DIAGNOSIS — E1140 Type 2 diabetes mellitus with diabetic neuropathy, unspecified: Secondary | ICD-10-CM | POA: Diagnosis not present

## 2016-09-05 LAB — BAYER DCA HB A1C WAIVED: HB A1C (BAYER DCA - WAIVED): 7.1 % — ABNORMAL HIGH (ref <7.0)

## 2016-09-05 NOTE — Progress Notes (Signed)
BP 139/89 (BP Location: Left Arm, Patient Position: Sitting, Cuff Size: Normal)   Pulse 76   Temp 97.8 F (36.6 C)   Wt 222 lb 1.6 oz (100.7 kg)   SpO2 99%   BMI 31.69 kg/m    Subjective:    Patient ID: Gerald Villegas, male    DOB: June 01, 1961, 55 y.o.   MRN: [id number]  HPI: Gerald Villegas is a 55 y.o. 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 With difficulty because of lifestyle and stress remembering second dose of metformin. Probably out of 30 takes only 15 in the evening takes mostly his morning dose of [place].  Patient also with diabetic peripheral neuropathy symptoms will come and go has tried various over-the-counter stuff with no real relief various socks sometimes helps sometimes doesn't reviewed DPN with patient Will observe for now.     Relevant past medical, surgical, family and social history reviewed and updated as indicated. Interim medical history since our last visit reviewed. Allergies and medications reviewed and updated.  Review of Systems  Constitutional: Negative.   Respiratory: Negative.   Cardiovascular: Negative.     Per HPI unless specifically indicated above     Objective:    BP 139/89 (BP Location: Left Arm, Patient Position: Sitting, Cuff Size: Normal)   Pulse 76   Temp 97.8 F (36.6 C)   Wt 222 lb 1.6 oz (100.7 kg)   SpO2 99%   BMI 31.69 kg/m   Wt Readings from Last 3 Encounters:  09/05/16 222 lb 1.6 oz (100.7 kg)  05/28/16 224 lb (101.6 kg)  04/20/16 221 lb (100.2 kg)    Physical Exam  Constitutional: He is oriented to person, place, and time. He appears well-developed and well-nourished. No distress.  HENT:  Head: Normocephalic and atraumatic.  Right Ear: Hearing normal.  Left Ear: Hearing normal.  Nose: Nose normal.  Eyes: Conjunctivae and lids are normal. Right eye exhibits no discharge. Left eye exhibits no discharge. No scleral icterus.  Cardiovascular:  Normal rate, regular rhythm and normal heart sounds.   Pulmonary/Chest: Effort normal and breath sounds normal. No respiratory distress.  Musculoskeletal: Normal range of motion.  Neurological: He is alert and oriented to person, place, and time.  Skin: Skin is intact. No rash noted.  Psychiatric: He has a normal mood and affect. His speech is normal and behavior is normal. Judgment and thought content normal. Cognition and memory are normal.    Results for orders placed or performed in visit on 05/28/16  Bayer DCA Hb A1c Waived  Result Value Ref Range   Bayer DCA Hb A1c Waived 6.9 <7.0 %  LP+ALT+AST Piccolo, Waived  Result Value Ref Range   ALT (SGPT) Piccolo, Waived 21 10 - 47 U/L   AST (SGOT) Piccolo, Waived 28 11 - 38 U/L   Cholesterol Piccolo, Waived CANCELED    HDL Chol Piccolo, Waived CANCELED    Triglycerides Piccolo,Waived CANCELED   Basic metabolic panel  Result Value Ref Range   Glucose 155 (H) 65 - 99 mg/dL   BUN 12 6 - 24 mg/dL   Creatinine, Ser 0.87 0.76 - 1.27 mg/dL   GFR calc non Af Amer 99 >59 mL/min/1.73   GFR calc Af Amer 115 >59 mL/min/1.73   BUN/Creatinine Ratio 14 9 - 20   Sodium 138 134 - 144 mmol/L   Potassium 4.7 3.5 - 5.2 mmol/L   Chloride 101 96 - 106 mmol/L  CO2 20 18 - 29 mmol/L   Calcium 9.4 8.7 - 10.2 mg/dL  Lipid Panel w/o Chol/HDL Ratio  Result Value Ref Range   Cholesterol, Total 146 100 - 199 mg/dL   Triglycerides 395 (H) 0 - 149 mg/dL   HDL 26 (L) >39 mg/dL   VLDL Cholesterol Cal 79 (H) 5 - 40 mg/dL   LDL Calculated 41 0 - 99 mg/dL  Specimen status report  Result Value Ref Range   specimen status report Comment       Assessment & Plan:   Problem List Items Addressed This Visit      Endocrine   Type 2 diabetes mellitus with diabetic neuropathy ([place]) - Primary    Discussed diabetes care and treatment linking behavior is to help remember medications weight-loss lifestyle changes Discussed diabetic peripheral neuropathy care and  treatment      Relevant Orders   Bayer DCA Hb A1c Waived    Other Visit Diagnoses    Immunization due       Relevant Orders   Flu Vaccine QUAD 36+ mos PF IM (Fluarix & Fluzone Quad PF) (Completed)       Follow up plan: Return in about 3 months (around 12/06/2016) for Physical Exam, Hemoglobin A1c.

## 2016-09-05 NOTE — Patient Instructions (Signed)

## 2016-09-05 NOTE — Assessment & Plan Note (Addendum)
Discussed diabetes care and treatment linking behavior is to help remember medications weight-loss lifestyle changes Discussed diabetic peripheral neuropathy care and treatment

## 2016-11-02 LAB — HM DIABETES EYE EXAM

## 2016-11-05 ENCOUNTER — Telehealth: Payer: Self-pay

## 2016-11-05 ENCOUNTER — Encounter: Payer: BC Managed Care – PPO | Admitting: Family Medicine

## 2016-11-05 NOTE — Telephone Encounter (Signed)
Patient aware. Will be here 1:15 -1:30.

## 2016-11-05 NOTE — Telephone Encounter (Signed)
He will be OK with that- it looks like 1:30 on Monday can be double booked because the current 1:30 is just an INR

## 2016-11-05 NOTE — Telephone Encounter (Signed)
Pt would like to know if he could be worked in Next Monday 11/13/15 for physical. Pt was scheduled for 11/06/15, provider was sick, appointment was cancelled. 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 Please advise.

## 2016-11-12 ENCOUNTER — Ambulatory Visit (INDEPENDENT_AMBULATORY_CARE_PROVIDER_SITE_OTHER): Payer: BC Managed Care – PPO | Admitting: Family Medicine

## 2016-11-12 ENCOUNTER — Encounter: Payer: Self-pay | Admitting: Family Medicine

## 2016-11-12 VITALS — BP 137/74 | HR 82 | Temp 98.2°F | Ht 70.47 in | Wt 225.0 lb

## 2016-11-12 DIAGNOSIS — Z0000 Encounter for general adult medical examination without abnormal findings: Secondary | ICD-10-CM | POA: Diagnosis not present

## 2016-11-12 DIAGNOSIS — Z125 Encounter for screening for malignant neoplasm of prostate: Secondary | ICD-10-CM | POA: Diagnosis not present

## 2016-11-12 DIAGNOSIS — E785 Hyperlipidemia, unspecified: Secondary | ICD-10-CM | POA: Diagnosis not present

## 2016-11-12 DIAGNOSIS — G4733 Obstructive sleep apnea (adult) (pediatric): Secondary | ICD-10-CM | POA: Diagnosis not present

## 2016-11-12 DIAGNOSIS — Z1329 Encounter for screening for other suspected endocrine disorder: Secondary | ICD-10-CM

## 2016-11-12 DIAGNOSIS — B351 Tinea unguium: Secondary | ICD-10-CM

## 2016-11-12 DIAGNOSIS — E1140 Type 2 diabetes mellitus with diabetic neuropathy, unspecified: Secondary | ICD-10-CM | POA: Diagnosis not present

## 2016-11-12 LAB — URINALYSIS, ROUTINE W REFLEX MICROSCOPIC
Bilirubin, UA: NEGATIVE
Ketones, UA: NEGATIVE
Leukocytes, UA: NEGATIVE
NITRITE UA: NEGATIVE
PH UA: 5.5 (ref 5.0–7.5)
Protein, UA: NEGATIVE
RBC, UA: NEGATIVE
Specific Gravity, UA: 1.015 (ref 1.005–1.030)
UUROB: 0.2 mg/dL (ref 0.2–1.0)

## 2016-11-12 LAB — MICROSCOPIC EXAMINATION: RBC MICROSCOPIC, UA: NONE SEEN /HPF (ref 0–?)

## 2016-11-12 LAB — BAYER DCA HB A1C WAIVED: HB A1C (BAYER DCA - WAIVED): 7.5 % — ABNORMAL HIGH (ref <7.0)

## 2016-11-12 LAB — MICROALBUMIN, URINE WAIVED
CREATININE, URINE WAIVED: 100 mg/dL (ref 10–300)
Microalb, Ur Waived: 10 mg/L (ref 0–19)
Microalb/Creat Ratio: <30 mg/g (ref <30)

## 2016-11-12 MED ORDER — METFORMIN HCL 1000 MG PO TABS: 1000.0000 mg | ORAL_TABLET | Freq: Every day | ORAL | 4 refills | Status: DC

## 2016-11-12 MED ORDER — DAPAGLIFLOZIN PROPANEDIOL 10 MG PO TABS: 10.0000 mg | ORAL_TABLET | Freq: Every day | ORAL | 4 refills | Status: DC

## 2016-11-12 MED ORDER — TERBINAFINE HCL 250 MG PO TABS: 250.0000 mg | ORAL_TABLET | Freq: Every day | ORAL | 2 refills | Status: DC

## 2016-11-12 MED ORDER — GABAPENTIN 300 MG PO CAPS: 600.0000 mg | ORAL_CAPSULE | Freq: Two times a day (BID) | ORAL | 3 refills | Status: DC

## 2016-11-12 NOTE — Progress Notes (Signed)
BP 137/74   Pulse 82   Temp 98.2 F (36.8 C) (Oral)   Ht 5' 10.47" (1.79 m)   Wt 225 lb (102.1 kg)   BMI 31.85 kg/m    Subjective:    Patient ID: Gerald Villegas, male    DOB: Oct 03, 1961, 56 y.o.   MRN: [id number]  HPI: Gerald Villegas is a 56 y.o. male  Chief Complaint  Patient presents with  . Annual Exam   Patient for physical doing well with medications ran out of for cigarette for Christmas has been taking an extra metformin with the thousand twice a day. Hasn't noticed much difference in blood sugars. Other medications doing well no complaints.  Patient's also had some left hand neuropathy symptoms tried wrist splints and care with carpal tunnel made no difference did protection of elbow for ulnar nerve and has made a significant difference has noticed a small little lump in the adjacent of his medial epicondyles and ulnar nerve which with manipulation causes left fourth fifth finger numbness and neuropathy symptoms. Also concerned about toenail fungus.  Relevant past medical, surgical, family and social history reviewed and updated as indicated. Interim medical history since our last visit reviewed. Allergies and medications reviewed and updated.  Review of Systems  Constitutional: Negative.   HENT: Negative.   Eyes: Negative.   Respiratory: Negative.   Cardiovascular: Negative.   Gastrointestinal: Negative.   Endocrine: Negative.   Genitourinary: Negative.   Musculoskeletal: Negative.   Skin: Negative.   Allergic/Immunologic: Negative.   Neurological: Negative.   Hematological: Negative.   Psychiatric/Behavioral: Negative.     Per HPI unless specifically indicated above     Objective:    BP 137/74   Pulse 82   Temp 98.2 F (36.8 C) (Oral)   Ht 5' 10.47" (1.79 m)   Wt 225 lb (102.1 kg)   BMI 31.85 kg/m   Wt Readings from Last 3 Encounters:  11/12/16 225 lb (102.1 kg)  09/05/16 222 lb 1.6 oz (100.7 kg)  05/28/16 224 lb (101.6 kg)    Physical Exam    Constitutional: He is oriented to person, place, and time. He appears well-developed and well-nourished.  HENT:  Head: Normocephalic and atraumatic.  Right Ear: External ear normal.  Left Ear: External ear normal.  Eyes: Conjunctivae and EOM are normal. Pupils are equal, round, and reactive to light.  Neck: Normal range of motion. Neck supple.  Cardiovascular: Normal rate, regular rhythm, normal heart sounds and intact distal pulses.   Pulmonary/Chest: Effort normal and breath sounds normal.  Abdominal: Soft. Bowel sounds are normal. There is no splenomegaly or hepatomegaly.  Genitourinary: Rectum normal, prostate normal and penis normal.  Musculoskeletal: Normal range of motion.  Neurological: He is alert and oriented to person, place, and time. He has normal reflexes.  Skin: No rash noted. No erythema.  Psychiatric: He has a normal mood and affect. His behavior is normal. Judgment and thought content normal.    Results for orders placed or performed in visit on 09/05/16  Bayer DCA Hb A1c Waived  Result Value Ref Range   Bayer DCA Hb A1c Waived 7.1 (H) <7.0 %      Assessment & Plan:   Problem List Items Addressed This Visit      Respiratory   Sleep apnea    The current medical regimen is effective;  continue present plan and medications.         Endocrine   Type 2 diabetes mellitus with diabetic  neuropathy ([place]) - Primary    We'll do another trial of gabapentin unsuccessful will consider other medications.        Relevant Medications   dapagliflozin propanediol (FARXIGA) 10 MG TABS tablet   metFORMIN (GLUCOPHAGE) 1000 MG tablet   gabapentin (NEURONTIN) 300 MG capsule   Other Relevant Orders   Bayer DCA Hb A1c Waived (STAT)   Comp Met (CMET)   CBC with Differential/Platelet   Urinalysis, Routine w reflex microscopic     Musculoskeletal and Integument   Onychomycosis    Discussed treatment of tiles with medications and limitations.      Relevant Medications    terbinafine (LAMISIL) 250 MG tablet   Other Relevant Orders   CBC with Differential/Platelet     Other   Hyperlipidemia    The current medical regimen is effective;  continue present plan and medications.       Relevant Orders   Microalbumin, Urine Waived (STAT)   Comp Met (CMET)   Lipid panel   CBC with Differential/Platelet    Other Visit Diagnoses    Annual physical exam       Relevant Orders   Comp Met (CMET)   Prostate cancer screening       Relevant Orders   PSA   Thyroid disorder screen       Relevant Orders   TSH       Follow up plan: Return in about 3 months (around 02/10/2017) for Hemoglobin A1c.

## 2016-11-12 NOTE — Assessment & Plan Note (Signed)
The current medical regimen is effective;  continue present plan and medications.  

## 2016-11-12 NOTE — Assessment & Plan Note (Signed)
Discussed treatment of tiles with medications and limitations.

## 2016-11-12 NOTE — Assessment & Plan Note (Addendum)
We'll do another trial of gabapentin unsuccessful will consider other medications.

## 2016-11-13 ENCOUNTER — Telehealth: Payer: Self-pay | Admitting: Family Medicine

## 2016-11-13 LAB — CBC WITH DIFFERENTIAL/PLATELET
BASOS: 1 %
Basophils Absolute: 0.1 10*3/uL (ref 0.0–0.2)
EOS (ABSOLUTE): 0.4 10*3/uL (ref 0.0–0.4)
Eos: 3 %
HEMATOCRIT: 46.6 % (ref 37.5–51.0)
Hemoglobin: 16.4 g/dL (ref 13.0–17.7)
Immature Grans (Abs): 0 10*3/uL (ref 0.0–0.1)
Immature Granulocytes: 0 %
LYMPHS ABS: 3.4 10*3/uL — AB (ref 0.7–3.1)
Lymphs: 30 %
MCH: 30.8 pg (ref 26.6–33.0)
MCHC: 35.2 g/dL (ref 31.5–35.7)
MCV: 88 fL (ref 79–97)
MONOS ABS: 0.6 10*3/uL (ref 0.1–0.9)
Monocytes: 5 %
NEUTROS ABS: 6.6 10*3/uL (ref 1.4–7.0)
Neutrophils: 61 %
Platelets: 195 10*3/uL (ref 150–379)
RBC: 5.32 x10E6/uL (ref 4.14–5.80)
RDW: 12.9 % (ref 12.3–15.4)
WBC: 11.1 10*3/uL — ABNORMAL HIGH (ref 3.4–10.8)

## 2016-11-13 LAB — COMPREHENSIVE METABOLIC PANEL
A/G RATIO: 1.6 (ref 1.2–2.2)
ALT: 16 IU/L (ref 0–44)
AST: 15 IU/L (ref 0–40)
Albumin: 4.4 g/dL (ref 3.5–5.5)
Alkaline Phosphatase: 52 IU/L (ref 39–117)
BUN/Creatinine Ratio: 14 (ref 9–20)
BUN: 13 mg/dL (ref 6–24)
Bilirubin Total: 0.4 mg/dL (ref 0.0–1.2)
CALCIUM: 9.2 mg/dL (ref 8.7–10.2)
CO2: 21 mmol/L (ref 18–29)
CREATININE: 0.91 mg/dL (ref 0.76–1.27)
Chloride: 100 mmol/L (ref 96–106)
GFR, EST AFRICAN AMERICAN: 111 mL/min/{1.73_m2} (ref >59)
GFR, EST NON AFRICAN AMERICAN: 96 mL/min/{1.73_m2} (ref >59)
GLOBULIN, TOTAL: 2.8 g/dL (ref 1.5–4.5)
Glucose: 163 mg/dL — ABNORMAL HIGH (ref 65–99)
Potassium: 4.3 mmol/L (ref 3.5–5.2)
Sodium: 139 mmol/L (ref 134–144)
TOTAL PROTEIN: 7.2 g/dL (ref 6.0–8.5)

## 2016-11-13 LAB — LIPID PANEL
CHOLESTEROL TOTAL: 140 mg/dL (ref 100–199)
Chol/HDL Ratio: 5 ratio units (ref 0.0–5.0)
HDL: 28 mg/dL — AB (ref >39)
LDL CALC: 47 mg/dL (ref 0–99)
Triglycerides: 323 mg/dL — ABNORMAL HIGH (ref 0–149)
VLDL CHOLESTEROL CAL: 65 mg/dL — AB (ref 5–40)

## 2016-11-13 LAB — PSA: Prostate Specific Ag, Serum: 0.2 ng/mL (ref 0.0–4.0)

## 2016-11-13 LAB — TSH: TSH: 3.5 u[IU]/mL (ref 0.450–4.500)

## 2016-11-13 NOTE — Telephone Encounter (Signed)
Phone call Discussed with patient elevated WBC patient was some head cold symptoms will observe recheck CBC next office visit.

## 2017-01-20 ENCOUNTER — Other Ambulatory Visit: Payer: Self-pay | Admitting: Family Medicine

## 2017-01-21 NOTE — Telephone Encounter (Signed)
  Last routine OV: 11/12/16 Next OV: 02/11/17

## 2017-02-11 ENCOUNTER — Ambulatory Visit: Payer: BC Managed Care – PPO | Admitting: Family Medicine

## 2017-03-25 ENCOUNTER — Other Ambulatory Visit: Payer: Self-pay | Admitting: Family Medicine

## 2017-03-25 DIAGNOSIS — B351 Tinea unguium: Secondary | ICD-10-CM

## 2017-03-26 NOTE — Telephone Encounter (Signed)
Last OV: 11/12/16 Next OV: 03/27/17  Lab Results  Component Value Date   CHOL 140 11/12/2016   HDL 28 (L) 11/12/2016   LDLCALC 47 11/12/2016   TRIG 323 (H) 11/12/2016   CHOLHDL 5.0 11/12/2016   Lab Results  Component Value Date   CREATININE 0.91 11/12/2016   BUN 13 11/12/2016   NA 139 11/12/2016   K 4.3 11/12/2016   CL 100 11/12/2016   CO2 21 11/12/2016

## 2017-03-27 ENCOUNTER — Encounter: Payer: Self-pay | Admitting: Family Medicine

## 2017-03-27 ENCOUNTER — Ambulatory Visit (INDEPENDENT_AMBULATORY_CARE_PROVIDER_SITE_OTHER): Payer: BC Managed Care – PPO | Admitting: Family Medicine

## 2017-03-27 VITALS — BP 132/80 | HR 79 | Ht 70.0 in | Wt 221.0 lb

## 2017-03-27 DIAGNOSIS — R6883 Chills (without fever): Secondary | ICD-10-CM

## 2017-03-27 DIAGNOSIS — E785 Hyperlipidemia, unspecified: Secondary | ICD-10-CM | POA: Diagnosis not present

## 2017-03-27 DIAGNOSIS — R5383 Other fatigue: Secondary | ICD-10-CM

## 2017-03-27 DIAGNOSIS — E1140 Type 2 diabetes mellitus with diabetic neuropathy, unspecified: Secondary | ICD-10-CM

## 2017-03-27 LAB — BAYER DCA HB A1C WAIVED: HB A1C: 7 % — AB (ref <7.0)

## 2017-03-27 MED ORDER — GABAPENTIN 300 MG PO CAPS: 600.0000 mg | ORAL_CAPSULE | Freq: Two times a day (BID) | ORAL | 3 refills | Status: DC

## 2017-03-27 NOTE — Assessment & Plan Note (Signed)
The current medical regimen is effective;  continue present plan and medications.  

## 2017-03-27 NOTE — Progress Notes (Signed)
   BP 132/80 (BP Location: Left Arm)   Pulse 79   Ht 5\' 10"  (1.778 m)   Wt 221 lb (100.2 kg)   SpO2 98%   BMI 31.71 kg/m    Subjective:    Patient ID: Gerald Villegas, male    DOB: June 19, 1961, 56 y.o.   MRN: [id number]  HPI: Gerald Villegas is a 56 y.o. male  Chief Complaint  Patient presents with  . Follow-up  . Diabetes  . Fatigue  Patient follow-up concerned about fatigue and possibility of low thyroid. Reviewed lifestyle patients gets up at 5 poor nutrition, no breakfast each. Better jelly at lunch and then at 4 clock is going to a second job and getting in bed by 10. Gabapentin helps help in diabetic peripheral neuropathy at 2 in the morning 2 in the evening. No issues with other diabetic medications.  noted low blood sugar spells.  Relevant past medical, surgical, family and social history reviewed and updated as indicated. Interim medical history since our last visit reviewed. Allergies and medications reviewed and updated.  Review of Systems  Constitutional: Negative.   Respiratory: Negative.   Cardiovascular: Negative.     Per HPI unless specifically indicated above     Objective:    BP 132/80 (BP Location: Left Arm)   Pulse 79   Ht 5\' 10"  (1.778 m)   Wt 221 lb (100.2 kg)   SpO2 98%   BMI 31.71 kg/m   Wt Readings from Last 3 Encounters:  03/27/17 221 lb (100.2 kg)  11/12/16 225 lb (102.1 kg)  09/05/16 222 lb 1.6 oz (100.7 kg)    Physical Exam  Constitutional: He is oriented to person, place, and time. He appears well-developed and well-nourished.  HENT:  Head: Normocephalic and atraumatic.  Eyes: Conjunctivae and EOM are normal.  Neck: Normal range of motion.  Cardiovascular: Normal rate, regular rhythm and normal heart sounds.   Pulmonary/Chest: Effort normal and breath sounds normal.  Musculoskeletal: Normal range of motion.  Neurological: He is alert and oriented to person, place, and time.  Skin: No erythema.  Psychiatric: He has a normal mood  and affect. His behavior is normal. Judgment and thought content normal.    Results for orders placed or performed in visit on 11/27/16  HM DIABETES EYE EXAM  Result Value Ref Range   HM Diabetic Eye Exam No Retinopathy No Retinopathy      Assessment & Plan:   Problem List Items Addressed This Visit      Endocrine   Type 2 diabetes mellitus with diabetic neuropathy ([place]) - Primary    Discuss improvement and control patient will do with lifestyle diet nutrition.      Relevant Medications   gabapentin (NEURONTIN) 300 MG capsule   Other Relevant Orders   Bayer DCA Hb A1c Waived     Other   Hyperlipidemia    The current medical regimen is effective;  continue present plan and medications.       Relevant Orders   Bayer DCA Hb A1c Waived    Other Visit Diagnoses    Other fatigue       Relevant Orders   TSH   Chills       Relevant Orders   TSH     Discuss fatigue and labs. Will predict TSH comes back normal discuss lifestyle diet nutrition sleep  Follow up plan: Return in about 3 months (around 06/27/2017) for Hemoglobin A1c, BMP.

## 2017-03-27 NOTE — Assessment & Plan Note (Signed)
Discuss improvement and control patient will do with lifestyle diet nutrition.

## 2017-03-28 ENCOUNTER — Encounter: Payer: Self-pay | Admitting: Family Medicine

## 2017-03-28 LAB — TSH: TSH: 1.96 u[IU]/mL (ref 0.450–4.500)

## 2017-04-01 ENCOUNTER — Telehealth: Payer: Self-pay | Admitting: Family Medicine

## 2017-04-01 NOTE — Telephone Encounter (Signed)
Patient wold like to know if his thyroid results are in and if so if he could know the results.   Please Advise.  Thank you

## 2017-04-01 NOTE — Telephone Encounter (Signed)
Looked in patient's chart, letter was sent with normal result. Called and let patient know this.

## 2017-07-24 ENCOUNTER — Ambulatory Visit (INDEPENDENT_AMBULATORY_CARE_PROVIDER_SITE_OTHER): Payer: BC Managed Care – PPO | Admitting: Family Medicine

## 2017-07-24 ENCOUNTER — Encounter: Payer: Self-pay | Admitting: Family Medicine

## 2017-07-24 VITALS — BP 122/84 | HR 74 | Wt 223.0 lb

## 2017-07-24 DIAGNOSIS — E785 Hyperlipidemia, unspecified: Secondary | ICD-10-CM

## 2017-07-24 DIAGNOSIS — E1140 Type 2 diabetes mellitus with diabetic neuropathy, unspecified: Secondary | ICD-10-CM

## 2017-07-24 DIAGNOSIS — R42 Dizziness and giddiness: Secondary | ICD-10-CM | POA: Diagnosis not present

## 2017-07-24 DIAGNOSIS — Z23 Encounter for immunization: Secondary | ICD-10-CM | POA: Diagnosis not present

## 2017-07-24 NOTE — Progress Notes (Signed)
   BP 122/84   Pulse 74   Wt 223 lb (101.2 kg)   SpO2 99%   BMI 32.00 kg/m    Subjective:    Patient ID: Gerald Villegas, male    DOB: 1960/12/05, 56 y.o.   MRN: [id number]  HPI: Gerald Villegas is a 56 y.o. male  Chief Complaint  Patient presents with  . Follow-up  . Diabetes  . Hypertension  Patient doing well all in all except over the last 2 weeks his had positional vertigo with lateral gaze develops dizziness at last for several minutes. No known trauma irritation. Diabetes doing well without complaints takes medications with no low blood sugar spells or issues.  Relevant past medical, surgical, family and social history reviewed and updated as indicated. Interim medical history since our last visit reviewed. Allergies and medications reviewed and updated.  Review of Systems  Constitutional: Negative.   Respiratory: Negative.   Cardiovascular: Negative.     Per HPI unless specifically indicated above     Objective:    BP 122/84   Pulse 74   Wt 223 lb (101.2 kg)   SpO2 99%   BMI 32.00 kg/m   Wt Readings from Last 3 Encounters:  07/24/17 223 lb (101.2 kg)  03/27/17 221 lb (100.2 kg)  11/12/16 225 lb (102.1 kg)    Physical Exam  Constitutional: He is oriented to person, place, and time. He appears well-developed and well-nourished.  HENT:  Head: Normocephalic and atraumatic.  Eyes: Conjunctivae and EOM are normal.  Neck: Normal range of motion.  Cardiovascular: Normal rate, regular rhythm and normal heart sounds.   Pulmonary/Chest: Effort normal and breath sounds normal.  Musculoskeletal: Normal range of motion.  Neurological: He is alert and oriented to person, place, and time.  Skin: No erythema.  Psychiatric: He has a normal mood and affect. His behavior is normal. Judgment and thought content normal.    Results for orders placed or performed in visit on 03/27/17  Bayer DCA Hb A1c Waived  Result Value Ref Range   Bayer DCA Hb A1c Waived 7.0 (H) <7.0 %    TSH  Result Value Ref Range   TSH 1.960 0.450 - 4.500 uIU/mL      Assessment & Plan:   Problem List Items Addressed This Visit      Endocrine   Type 2 diabetes mellitus with diabetic neuropathy ([place]) - Primary    The current medical regimen is effective;  continue present plan and medications.        Relevant Orders   Bayer DCA Hb A1c Waived   Basic metabolic panel     Other   Hyperlipidemia   Relevant Orders   Bayer DCA Hb A1c Waived   Basic metabolic panel   Vertigo    Discuss physical therapy maneuvers to chew or positional vertigo will avoid for now but may need in the future if not getting better.       Other Visit Diagnoses    Needs flu shot       Relevant Orders   Flu Vaccine QUAD 6+ mos PF IM (Fluarix Quad PF) (Completed)       Follow up plan: Return in about 3 months (around 10/23/2017) for Physical Exam, Hemoglobin A1c.

## 2017-07-24 NOTE — Assessment & Plan Note (Signed)
The current medical regimen is effective;  continue present plan and medications.  

## 2017-07-24 NOTE — Assessment & Plan Note (Signed)
Discuss physical therapy maneuvers to chew or positional vertigo will avoid for now but may need in the future if not getting better.

## 2017-07-25 ENCOUNTER — Encounter: Payer: Self-pay | Admitting: Family Medicine

## 2017-07-25 LAB — BASIC METABOLIC PANEL
BUN/Creatinine Ratio: 20 (ref 9–20)
BUN: 15 mg/dL (ref 6–24)
CO2: 22 mmol/L (ref 20–29)
CREATININE: 0.74 mg/dL — AB (ref 0.76–1.27)
Calcium: 9.4 mg/dL (ref 8.7–10.2)
Chloride: 101 mmol/L (ref 96–106)
GFR, EST AFRICAN AMERICAN: 122 mL/min/{1.73_m2} (ref >59)
GFR, EST NON AFRICAN AMERICAN: 105 mL/min/{1.73_m2} (ref >59)
Glucose: 190 mg/dL — ABNORMAL HIGH (ref 65–99)
Potassium: 4.1 mmol/L (ref 3.5–5.2)
SODIUM: 139 mmol/L (ref 134–144)

## 2017-07-25 LAB — BAYER DCA HB A1C WAIVED: HB A1C (BAYER DCA - WAIVED): 6.7 % (ref <7.0)

## 2017-10-30 ENCOUNTER — Encounter: Payer: BC Managed Care – PPO | Admitting: Family Medicine

## 2017-11-17 ENCOUNTER — Other Ambulatory Visit: Payer: Self-pay | Admitting: Family Medicine

## 2017-11-17 DIAGNOSIS — E1140 Type 2 diabetes mellitus with diabetic neuropathy, unspecified: Secondary | ICD-10-CM

## 2017-11-18 LAB — HM DIABETES EYE EXAM

## 2017-11-19 ENCOUNTER — Other Ambulatory Visit: Payer: BC Managed Care – PPO

## 2017-11-19 ENCOUNTER — Encounter: Payer: Self-pay | Admitting: Family Medicine

## 2017-11-19 ENCOUNTER — Ambulatory Visit: Payer: BC Managed Care – PPO | Admitting: Family Medicine

## 2017-11-19 VITALS — BP 136/80 | HR 90 | Ht 70.28 in | Wt 226.0 lb

## 2017-11-19 DIAGNOSIS — Z125 Encounter for screening for malignant neoplasm of prostate: Secondary | ICD-10-CM | POA: Diagnosis not present

## 2017-11-19 DIAGNOSIS — E1140 Type 2 diabetes mellitus with diabetic neuropathy, unspecified: Secondary | ICD-10-CM

## 2017-11-19 DIAGNOSIS — Z0001 Encounter for general adult medical examination with abnormal findings: Secondary | ICD-10-CM

## 2017-11-19 DIAGNOSIS — E785 Hyperlipidemia, unspecified: Secondary | ICD-10-CM

## 2017-11-19 DIAGNOSIS — Z0000 Encounter for general adult medical examination without abnormal findings: Secondary | ICD-10-CM

## 2017-11-19 DIAGNOSIS — Z1329 Encounter for screening for other suspected endocrine disorder: Secondary | ICD-10-CM

## 2017-11-19 LAB — URINALYSIS, ROUTINE W REFLEX MICROSCOPIC
BILIRUBIN UA: NEGATIVE
Ketones, UA: NEGATIVE
Leukocytes, UA: NEGATIVE
Nitrite, UA: NEGATIVE
PROTEIN UA: NEGATIVE
Specific Gravity, UA: 1.015 (ref 1.005–1.030)
UUROB: 0.2 mg/dL (ref 0.2–1.0)
pH, UA: 5 (ref 5.0–7.5)

## 2017-11-19 LAB — MICROSCOPIC EXAMINATION: Bacteria, UA: NONE SEEN

## 2017-11-19 LAB — BAYER DCA HB A1C WAIVED: HB A1C: 7 % — AB (ref <7.0)

## 2017-11-19 MED ORDER — METFORMIN HCL 1000 MG PO TABS: 1000.0000 mg | ORAL_TABLET | Freq: Every day | ORAL | 4 refills | Status: DC

## 2017-11-19 MED ORDER — DAPAGLIFLOZIN PROPANEDIOL 10 MG PO TABS: 10.0000 mg | ORAL_TABLET | Freq: Every day | ORAL | 4 refills | Status: DC

## 2017-11-19 MED ORDER — GABAPENTIN 300 MG PO CAPS: 600.0000 mg | ORAL_CAPSULE | Freq: Two times a day (BID) | ORAL | 4 refills | Status: DC

## 2017-11-19 NOTE — Assessment & Plan Note (Signed)
The current medical regimen is effective;  continue present plan and medications.  

## 2017-11-19 NOTE — Progress Notes (Signed)
BP 136/80   Pulse 90   Ht 5' 10.28" (1.785 m)   Wt 226 lb (102.5 kg)   SpO2 98%   BMI 32.17 kg/m    Subjective:    Patient ID: Gerald Villegas, male    DOB: Mar 08, 1961, 57 y.o.   MRN: [id number]  HPI: Gerald Villegas is a 57 y.o. male  Chief Complaint  Patient presents with  . Annual Exam  . Abdominal Pain  Patient with complaints of epigastric tenderness soreness coming on after a meal.  Bothered some during the night may be a little bit of Tums helps slightly but has not tried other medication. Diabetes all in all doing well taking medications without problems no low blood sugar spells. Sharp neuropathy pains in his feet is controlled with gabapentin 600 mg at bedtime and in the morning with no side effects. Patient also noted some irregular bowel movements no real constipation but which is a change from regular bowel movements and some nonspecific lower left abdominal discomfort.  This is been ongoing for about 2 months or so. Relevant past medical, surgical, family and social history reviewed and updated as indicated. Interim medical history since our last visit reviewed. Allergies and medications reviewed and updated.  Review of Systems  Constitutional: Negative.   HENT: Negative.   Eyes: Negative.   Respiratory: Negative.   Cardiovascular: Negative.   Gastrointestinal: Negative.   Endocrine: Negative.   Genitourinary: Negative.   Musculoskeletal: Negative.   Skin: Negative.   Allergic/Immunologic: Negative.   Neurological: Negative.   Hematological: Negative.   Psychiatric/Behavioral: Negative.     Per HPI unless specifically indicated above     Objective:    BP 136/80   Pulse 90   Ht 5' 10.28" (1.785 m)   Wt 226 lb (102.5 kg)   SpO2 98%   BMI 32.17 kg/m   Wt Readings from Last 3 Encounters:  11/19/17 226 lb (102.5 kg)  07/24/17 223 lb (101.2 kg)  03/27/17 221 lb (100.2 kg)    Physical Exam  Constitutional: He is oriented to person, place, and time.  He appears well-developed and well-nourished.  HENT:  Head: Normocephalic and atraumatic.  Right Ear: External ear normal.  Left Ear: External ear normal.  Eyes: Conjunctivae and EOM are normal. Pupils are equal, round, and reactive to light.  Neck: Normal range of motion. Neck supple.  Cardiovascular: Normal rate, regular rhythm, normal heart sounds and intact distal pulses.  Pulmonary/Chest: Effort normal and breath sounds normal.  Abdominal: Soft. Bowel sounds are normal. There is no splenomegaly or hepatomegaly.  Genitourinary: Rectum normal, prostate normal and penis normal.  Musculoskeletal: Normal range of motion.  Neurological: He is alert and oriented to person, place, and time. He has normal reflexes.  Skin: No rash noted. No erythema.  Psychiatric: He has a normal mood and affect. His behavior is normal. Judgment and thought content normal.    Results for orders placed or performed in visit on 11/19/17  Microscopic Examination  Result Value Ref Range   WBC, UA 0-5 0 - 5 /hpf   RBC, UA 0-2 0 - 2 /hpf   Epithelial Cells (non renal) 0-10 0 - 10 /hpf   Bacteria, UA None seen None seen/Few  Bayer DCA Hb A1c Waived  Result Value Ref Range   Bayer DCA Hb A1c Waived 7.0 (H) <7.0 %  Urinalysis, Routine w reflex microscopic  Result Value Ref Range   Specific Gravity, UA 1.015 1.005 - 1.030  pH, UA 5.0 5.0 - 7.5   Color, UA Yellow Yellow   Appearance Ur Hazy (A) Clear   Leukocytes, UA Negative Negative   Protein, UA Negative Negative/Trace   Glucose, UA 3+ (A) Negative   Ketones, UA Negative Negative   RBC, UA Trace (A) Negative   Bilirubin, UA Negative Negative   Urobilinogen, Ur 0.2 0.2 - 1.0 mg/dL   Nitrite, UA Negative Negative   Microscopic Examination See below:       Assessment & Plan:   Problem List Items Addressed This Visit      Endocrine   Type 2 diabetes mellitus with diabetic neuropathy ([facility]) - Primary    The current medical regimen is effective;   continue present plan and medications.       Relevant Medications   gabapentin (NEURONTIN) 300 MG capsule   dapagliflozin propanediol (FARXIGA) 10 MG TABS tablet   metFORMIN (GLUCOPHAGE) 1000 MG tablet   Other Relevant Orders   CBC with Differential/Platelet   Comprehensive metabolic panel   Lipid panel   Urinalysis, Routine w reflex microscopic (Completed)   Bayer DCA Hb A1c Waived (Completed)     Other   Hyperlipidemia   Relevant Orders   CBC with Differential/Platelet   Comprehensive metabolic panel   Lipid panel   Urinalysis, Routine w reflex microscopic (Completed)   Bayer DCA Hb A1c Waived (Completed)    Other Visit Diagnoses    Prostate cancer screening       Relevant Orders   PSA   Thyroid disorder screen       Relevant Orders   TSH   PE (physical exam), annual          Discuss reflux and using Prilosec abd pain   Follow up plan: Return in about 3 months (around 02/17/2018) for Hemoglobin A1c.

## 2017-11-20 ENCOUNTER — Encounter: Payer: Self-pay | Admitting: Family Medicine

## 2017-11-20 LAB — TSH: TSH: 3.52 u[IU]/mL (ref 0.450–4.500)

## 2017-11-20 LAB — CBC WITH DIFFERENTIAL/PLATELET
BASOS: 1 %
Basophils Absolute: 0.1 10*3/uL (ref 0.0–0.2)
EOS (ABSOLUTE): 0.3 10*3/uL (ref 0.0–0.4)
Eos: 4 %
Hematocrit: 49.6 % (ref 37.5–51.0)
Hemoglobin: 17.5 g/dL (ref 13.0–17.7)
IMMATURE GRANULOCYTES: 0 %
Immature Grans (Abs): 0 10*3/uL (ref 0.0–0.1)
LYMPHS ABS: 2.5 10*3/uL (ref 0.7–3.1)
Lymphs: 29 %
MCH: 31.5 pg (ref 26.6–33.0)
MCHC: 35.3 g/dL (ref 31.5–35.7)
MCV: 89 fL (ref 79–97)
MONOS ABS: 0.4 10*3/uL (ref 0.1–0.9)
Monocytes: 5 %
NEUTROS ABS: 5.3 10*3/uL (ref 1.4–7.0)
NEUTROS PCT: 61 %
Platelets: 174 10*3/uL (ref 150–379)
RBC: 5.56 x10E6/uL (ref 4.14–5.80)
RDW: 12.9 % (ref 12.3–15.4)
WBC: 8.6 10*3/uL (ref 3.4–10.8)

## 2017-11-20 LAB — COMPREHENSIVE METABOLIC PANEL
A/G RATIO: 1.8 (ref 1.2–2.2)
ALT: 16 IU/L (ref 0–44)
AST: 13 IU/L (ref 0–40)
Albumin: 4.9 g/dL (ref 3.5–5.5)
Alkaline Phosphatase: 48 IU/L (ref 39–117)
BUN/Creatinine Ratio: 11 (ref 9–20)
BUN: 12 mg/dL (ref 6–24)
Bilirubin Total: 0.5 mg/dL (ref 0.0–1.2)
CALCIUM: 9.7 mg/dL (ref 8.7–10.2)
CO2: 21 mmol/L (ref 20–29)
Chloride: 102 mmol/L (ref 96–106)
Creatinine, Ser: 1.05 mg/dL (ref 0.76–1.27)
GFR calc Af Amer: 93 mL/min/{1.73_m2} (ref >59)
GFR, EST NON AFRICAN AMERICAN: 80 mL/min/{1.73_m2} (ref >59)
GLOBULIN, TOTAL: 2.7 g/dL (ref 1.5–4.5)
Glucose: 174 mg/dL — ABNORMAL HIGH (ref 65–99)
POTASSIUM: 4.8 mmol/L (ref 3.5–5.2)
SODIUM: 140 mmol/L (ref 134–144)
Total Protein: 7.6 g/dL (ref 6.0–8.5)

## 2017-11-20 LAB — LIPID PANEL
CHOLESTEROL TOTAL: 169 mg/dL (ref 100–199)
Chol/HDL Ratio: 6.3 ratio — ABNORMAL HIGH (ref 0.0–5.0)
HDL: 27 mg/dL — ABNORMAL LOW (ref >39)
LDL CALC: 64 mg/dL (ref 0–99)
TRIGLYCERIDES: 390 mg/dL — AB (ref 0–149)
VLDL CHOLESTEROL CAL: 78 mg/dL — AB (ref 5–40)

## 2017-11-20 LAB — PSA: Prostate Specific Ag, Serum: 0.2 ng/mL (ref 0.0–4.0)

## 2018-02-18 ENCOUNTER — Encounter: Payer: Self-pay | Admitting: Family Medicine

## 2018-02-18 ENCOUNTER — Ambulatory Visit: Payer: BC Managed Care – PPO | Admitting: Family Medicine

## 2018-02-18 VITALS — BP 127/82 | HR 87 | Ht 72.0 in | Wt 222.0 lb

## 2018-02-18 DIAGNOSIS — E785 Hyperlipidemia, unspecified: Secondary | ICD-10-CM | POA: Diagnosis not present

## 2018-02-18 DIAGNOSIS — E1140 Type 2 diabetes mellitus with diabetic neuropathy, unspecified: Secondary | ICD-10-CM

## 2018-02-18 NOTE — Assessment & Plan Note (Signed)
Reviewed no medications triglycerides are up from time to time but otherwise good LDL control.

## 2018-02-18 NOTE — Assessment & Plan Note (Addendum)
Discussed worsening of diabetes control patient will do better with diet nutrition exercise.

## 2018-02-18 NOTE — Progress Notes (Signed)
   BP 127/82   Pulse 87   Ht 6' (1.829 m)   Wt 222 lb (100.7 kg)   SpO2 98%   BMI 30.11 kg/m    Subjective:    Patient ID: Gerald Villegas, male    DOB: Feb 20, 1961, 57 y.o.   MRN: [id number]  HPI: Gerald Villegas is a 57 y.o. male  Chief Complaint  Patient presents with  . Follow-up  . Diabetes  Doing well no low blood sugar spells no issues with medications takes metformin faithfully in good control of diabetes. Gabapentin for neuropathy doing okay no complaints. Allergies doing okay in spite of hard of allergy season.  Relevant past medical, surgical, family and social history reviewed and updated as indicated. Interim medical history since our last visit reviewed. Allergies and medications reviewed and updated.  Review of Systems  Constitutional: Negative.   Respiratory: Negative.   Cardiovascular: Negative.     Per HPI unless specifically indicated above     Objective:    BP 127/82   Pulse 87   Ht 6' (1.829 m)   Wt 222 lb (100.7 kg)   SpO2 98%   BMI 30.11 kg/m   Wt Readings from Last 3 Encounters:  02/18/18 222 lb (100.7 kg)  11/19/17 226 lb (102.5 kg)  07/24/17 223 lb (101.2 kg)    Physical Exam  Constitutional: He is oriented to person, place, and time. He appears well-developed and well-nourished.  HENT:  Head: Normocephalic and atraumatic.  Eyes: Conjunctivae and EOM are normal.  Neck: Normal range of motion.  Cardiovascular: Normal rate, regular rhythm and normal heart sounds.  Pulmonary/Chest: Effort normal and breath sounds normal.  Musculoskeletal: Normal range of motion.  Neurological: He is alert and oriented to person, place, and time.  Skin: No erythema.  Psychiatric: He has a normal mood and affect. His behavior is normal. Judgment and thought content normal.    Results for orders placed or performed in visit on 12/03/17  HM DIABETES EYE EXAM  Result Value Ref Range   HM Diabetic Eye Exam No Retinopathy No Retinopathy      Assessment &  Plan:   Problem List Items Addressed This Visit      Endocrine   Type 2 diabetes mellitus with diabetic neuropathy ([place]) - Primary    The current medical regimen is effective;  continue present plan and medications.       Relevant Orders   Bayer DCA Hb A1c Waived     Other   Hyperlipidemia    Reviewed no medications triglycerides are up from time to time but otherwise good LDL control.          Follow up plan: Return in about 3 months (around 05/20/2018) for Hemoglobin A1c, BMP,  Lipids, ALT, AST.

## 2018-02-19 LAB — BAYER DCA HB A1C WAIVED: HB A1C: 7.2 % — AB (ref <7.0)

## 2018-04-22 ENCOUNTER — Other Ambulatory Visit: Payer: Self-pay | Admitting: Family Medicine

## 2018-04-24 NOTE — Telephone Encounter (Signed)
LOV 11-19-17 with Dr. Jeananne Rama / Refill request for flonase / Last filled:  Disp Refills Start End   fluticasone (FLONASE) 50 MCG/ACT nasal spray 16 g 12 01/21/2017    Sig: USE 2 SPRAYS IN EACH NOSTRIL ONCE DAILY.    / Refilled per protocol.

## 2018-05-21 ENCOUNTER — Ambulatory Visit: Payer: BC Managed Care – PPO | Admitting: Family Medicine

## 2018-05-21 ENCOUNTER — Encounter: Payer: Self-pay | Admitting: Family Medicine

## 2018-05-21 VITALS — BP 134/75 | HR 71 | Temp 98.9°F | Ht 65.5 in | Wt 220.0 lb

## 2018-05-21 DIAGNOSIS — E785 Hyperlipidemia, unspecified: Secondary | ICD-10-CM | POA: Diagnosis not present

## 2018-05-21 DIAGNOSIS — E1140 Type 2 diabetes mellitus with diabetic neuropathy, unspecified: Secondary | ICD-10-CM

## 2018-05-21 DIAGNOSIS — Z23 Encounter for immunization: Secondary | ICD-10-CM | POA: Diagnosis not present

## 2018-05-21 LAB — LP+ALT+AST PICCOLO, WAIVED
ALT (SGPT) PICCOLO, WAIVED: 21 U/L (ref 10–47)
AST (SGOT) Piccolo, Waived: 27 U/L (ref 11–38)
CHOLESTEROL PICCOLO, WAIVED: 144 mg/dL (ref <200)
Chol/HDL Ratio Piccolo,Waive: 5.8 mg/dL — ABNORMAL HIGH
HDL CHOL PICCOLO, WAIVED: 25 mg/dL — AB (ref >59)
LDL CHOL CALC PICCOLO WAIVED: 45 mg/dL (ref <100)
TRIGLYCERIDES PICCOLO,WAIVED: 372 mg/dL — AB (ref <150)
VLDL CHOL CALC PICCOLO,WAIVE: 74 mg/dL — AB (ref <30)

## 2018-05-21 LAB — BAYER DCA HB A1C WAIVED: HB A1C (BAYER DCA - WAIVED): 6.9 % (ref <7.0)

## 2018-05-21 NOTE — Progress Notes (Signed)
BP 134/75 (BP Location: Left Arm, Patient Position: Sitting, Cuff Size: Normal)   Pulse 71   Temp 98.9 F (37.2 C) (Tympanic)   Ht 5' 5.5" (1.664 m)   Wt 220 lb (99.8 kg)   SpO2 97%   BMI 36.05 kg/m    Subjective:    Patient ID: Gerald Villegas, male    DOB: [birth year], 57 y.o.   MRN: [id number]  HPI: Gerald Villegas is a 57 y.o. male  Chief Complaint  Patient presents with  . Diabetes  . Hyperlipidemia   Patient all in all doing well developed some head cold allergy and drainage with some cough no fever chills getting a little bit better.  Tried some OTC with some relief. Has been on vacation with poor diet but otherwise been doing well with diabetes no complaints no low blood sugar spells or issues. Blood pressure cholesterol also doing well no complaints.  Relevant past medical, surgical, family and social history reviewed and updated as indicated. Interim medical history since our last visit reviewed. Allergies and medications reviewed and updated.  Review of Systems  Constitutional: Negative.   Respiratory: Negative.   Cardiovascular: Negative.     Per HPI unless specifically indicated above     Objective:    BP 134/75 (BP Location: Left Arm, Patient Position: Sitting, Cuff Size: Normal)   Pulse 71   Temp 98.9 F (37.2 C) (Tympanic)   Ht 5' 5.5" (1.664 m)   Wt 220 lb (99.8 kg)   SpO2 97%   BMI 36.05 kg/m   Wt Readings from Last 3 Encounters:  05/21/18 220 lb (99.8 kg)  02/18/18 222 lb (100.7 kg)  11/19/17 226 lb (102.5 kg)    Physical Exam  Constitutional: He is oriented to person, place, and time. He appears well-developed and well-nourished.  HENT:  Head: Normocephalic and atraumatic.  Right Ear: External ear normal.  Left Ear: External ear normal.  Nose: Nose normal.  Mouth/Throat: Oropharynx is clear and moist. No oropharyngeal exudate.  Eyes: Conjunctivae and EOM are normal.  Neck: Normal range of motion.  Cardiovascular: Normal rate, regular  rhythm and normal heart sounds.  Pulmonary/Chest: Effort normal and breath sounds normal.  Musculoskeletal: Normal range of motion.  Neurological: He is alert and oriented to person, place, and time.  Skin: No erythema.  Psychiatric: He has a normal mood and affect. His behavior is normal. Judgment and thought content normal.    Results for orders placed or performed in visit on 02/18/18  Bayer DCA Hb A1c Waived  Result Value Ref Range   HB A1C (BAYER DCA - WAIVED) 7.2 (H) <7.0 %      Assessment & Plan:   Problem List Items Addressed This Visit      Endocrine   Type 2 diabetes mellitus with diabetic neuropathy ([place]) - Primary    The current medical regimen is effective;  continue present plan and medications.       Relevant Orders   Bayer DCA Hb A1c Waived   Basic metabolic panel   LP+ALT+AST [place], Waived     Other   Hyperlipidemia    The current medical regimen is effective;  continue present plan and medications.       Relevant Orders   Basic metabolic panel    Other Visit Diagnoses    Need for tetanus booster       Relevant Orders   Td vaccine greater than or equal to 7yo preservative free IM (Completed)  Follow up plan: Return in about 3 months (around 08/21/2018) for Hemoglobin A1c.

## 2018-05-21 NOTE — Assessment & Plan Note (Signed)
The current medical regimen is effective;  continue present plan and medications.  

## 2018-05-21 NOTE — Patient Instructions (Signed)
Td Vaccine (Tetanus and Diphtheria): What You Need to Know 1. Why get vaccinated? Tetanus  and diphtheria are very serious diseases. They are rare in the [place] today, but people who do become infected often have severe complications. Td vaccine is used to protect adolescents and adults from both of these diseases. Both tetanus and diphtheria are infections caused by bacteria. Diphtheria spreads from person to person through coughing or sneezing. Tetanus-causing bacteria enter the body through cuts, scratches, or wounds. TETANUS (lockjaw) causes painful muscle tightening and stiffness, usually all over the body.  It can lead to tightening of muscles in the head and neck so you can't open your mouth, swallow, or sometimes even breathe. Tetanus kills about 1 out of every 10 people who are infected even after receiving the best medical care.  DIPHTHERIA can cause a thick coating to form in the back of the throat.  It can lead to breathing problems, paralysis, heart failure, and death.  Before vaccines, as many as 200,000 cases of diphtheria and hundreds of cases of tetanus were reported in the [place] each year. Since vaccination began, reports of cases for both diseases have dropped by about 99%. 2. Td vaccine Td vaccine can protect adolescents and adults from tetanus and diphtheria. Td is usually given as a booster dose every 10 years but it can also be given earlier after a severe and dirty wound or burn. Another vaccine, called Tdap, which protects against pertussis in addition to tetanus and diphtheria, is sometimes recommended instead of Td vaccine. Your doctor or the person giving you the vaccine can give you more information. Td may safely be given at the same time as other vaccines. 3. Some people should not get this vaccine  A person who has ever had a life-threatening allergic reaction after a previous dose of any tetanus or diphtheria containing vaccine, OR has a severe  allergy to any part of this vaccine, should not get Td vaccine. Tell the person giving the vaccine about any severe allergies.  Talk to your doctor if you: ? had severe pain or swelling after any vaccine containing diphtheria or tetanus, ? ever had a condition called Guillain Barre Syndrome (GBS), ? aren't feeling well on the day the shot is scheduled. 4. What are the risks from Td vaccine? With any medicine, including vaccines, there is a chance of side effects. These are usually mild and go away on their own. Serious reactions are also possible but are rare. Most people who get Td vaccine do not have any problems with it. Mild problems following Td vaccine: (Did not interfere with activities)  Pain where the shot was given (about 8 people in 10)  Redness or swelling where the shot was given (about 1 person in 4)  Mild fever (rare)  Headache (about 1 person in 4)  Tiredness (about 1 person in 4)  Moderate problems following Td vaccine: (Interfered with activities, but did not require medical attention)  Fever over 102F (rare)  Severe problems following Td vaccine: (Unable to perform usual activities; required medical attention)  Swelling, severe pain, bleeding and/or redness in the arm where the shot was given (rare).  Problems that could happen after any vaccine:  People sometimes faint after a medical procedure, including vaccination. Sitting or lying down for about 15 minutes can help prevent fainting, and injuries caused by a fall. Tell your doctor if you feel dizzy, or have vision changes or ringing in the ears.  Some people get   severe pain in the shoulder and have difficulty moving the arm where a shot was given. This happens very rarely.  Any medication can cause a severe allergic reaction. Such reactions from a vaccine are very rare, estimated at fewer than 1 in a million doses, and would happen within a few minutes to a few hours after the vaccination. As with any  medicine, there is a very remote chance of a vaccine causing a serious injury or death. The safety of vaccines is always being monitored. For more information, visit: www.cdc.gov/vaccinesafety/ 5. What if there is a serious reaction? What should I look for? Look for anything that concerns you, such as signs of a severe allergic reaction, very high fever, or unusual behavior. Signs of a severe allergic reaction can include hives, swelling of the face and throat, difficulty breathing, a fast heartbeat, dizziness, and weakness. These would usually start a few minutes to a few hours after the vaccination. What should I do?  If you think it is a severe allergic reaction or other emergency that can't wait, call 9-1-1 or get the person to the nearest hospital. Otherwise, call your doctor.  Afterward, the reaction should be reported to the Vaccine Adverse Event Reporting System (VAERS). Your doctor might file this report, or you can do it yourself through the VAERS web site at www.vaers.hhs.gov, or by calling 1-800-822-7967. ? VAERS does not give medical advice. 6. The National Vaccine Injury Compensation Program The National Vaccine Injury Compensation Program (VICP) is a federal program that was created to compensate people who may have been injured by certain vaccines. Persons who believe they may have been injured by a vaccine can learn about the program and about filing a claim by calling 1-800-338-2382 or visiting the VICP website at www.hrsa.gov/vaccinecompensation. There is a time limit to file a claim for compensation. 7. How can I learn more?  Ask your doctor. He or she can give you the vaccine package insert or suggest other sources of information.  Call your local or state health department.  Contact the Centers for Disease Control and Prevention (CDC): ? Call 1-800-232-4636 (1-800-CDC-INFO) ? Visit CDC's website at www.cdc.gov/vaccines CDC Td Vaccine VIS (02/07/16) This information is  not intended to replace advice given to you by your health care provider. Make sure you discuss any questions you have with your health care provider. Document Released: 08/12/2006 Document Revised: 07/05/2016 Document Reviewed: 07/05/2016 Elsevier Interactive Patient Education  2017 [employer]  

## 2018-05-22 ENCOUNTER — Encounter: Payer: Self-pay | Admitting: Family Medicine

## 2018-05-22 LAB — BASIC METABOLIC PANEL
BUN / CREAT RATIO: 18 (ref 9–20)
BUN: 15 mg/dL (ref 6–24)
CHLORIDE: 103 mmol/L (ref 96–106)
CO2: 19 mmol/L — ABNORMAL LOW (ref 20–29)
CREATININE: 0.84 mg/dL (ref 0.76–1.27)
Calcium: 9.2 mg/dL (ref 8.7–10.2)
GFR calc Af Amer: 115 mL/min/{1.73_m2} (ref >59)
GFR calc non Af Amer: 99 mL/min/{1.73_m2} (ref >59)
Glucose: 182 mg/dL — ABNORMAL HIGH (ref 65–99)
Potassium: 4.4 mmol/L (ref 3.5–5.2)
SODIUM: 138 mmol/L (ref 134–144)

## 2018-08-27 ENCOUNTER — Ambulatory Visit: Payer: BC Managed Care – PPO | Admitting: Family Medicine

## 2018-08-27 ENCOUNTER — Encounter: Payer: Self-pay | Admitting: Family Medicine

## 2018-08-27 VITALS — BP 118/71 | HR 76 | Temp 97.9°F | Ht 70.0 in | Wt 219.4 lb

## 2018-08-27 DIAGNOSIS — Z23 Encounter for immunization: Secondary | ICD-10-CM | POA: Diagnosis not present

## 2018-08-27 DIAGNOSIS — G4733 Obstructive sleep apnea (adult) (pediatric): Secondary | ICD-10-CM

## 2018-08-27 DIAGNOSIS — E1140 Type 2 diabetes mellitus with diabetic neuropathy, unspecified: Secondary | ICD-10-CM | POA: Diagnosis not present

## 2018-08-27 DIAGNOSIS — E7800 Pure hypercholesterolemia, unspecified: Secondary | ICD-10-CM | POA: Diagnosis not present

## 2018-08-27 LAB — MICROALBUMIN / CREATININE URINE RATIO

## 2018-08-27 NOTE — Assessment & Plan Note (Signed)
No medications currently  

## 2018-08-27 NOTE — Assessment & Plan Note (Signed)
The current medical regimen is effective;  continue present plan and medications.  

## 2018-08-27 NOTE — Progress Notes (Signed)
BP 118/71 (BP Location: Left Arm, Patient Position: Sitting, Cuff Size: Normal)   Pulse 76   Temp 97.9 F (36.6 C) (Oral)   Ht 5\' 10"  (1.778 m)   Wt 219 lb 6.4 oz (99.5 kg)   SpO2 98%   BMI 31.48 kg/m    Subjective:    Patient ID: Gerald Villegas, male    DOB: [birth year], 57 y.o.   MRN: [id number]  HPI: Gerald Villegas is a 57 y.o. male  Chief Complaint  Patient presents with  . Diabetes  Follow-up diabetes doing well no complaints taking medications without problems or low blood sugar spells. Diabetic peripheral neuropathy doing well with gabapentin no complaints good control. No issues with cholesterol or other medications. Uses CPAP without problems Relevant past medical, surgical, family and social history reviewed and updated as indicated. Interim medical history since our last visit reviewed. Allergies and medications reviewed and updated.  Review of Systems  Constitutional: Negative.   Respiratory: Negative.   Cardiovascular: Negative.     Per HPI unless specifically indicated above     Objective:    BP 118/71 (BP Location: Left Arm, Patient Position: Sitting, Cuff Size: Normal)   Pulse 76   Temp 97.9 F (36.6 C) (Oral)   Ht 5\' 10"  (1.778 m)   Wt 219 lb 6.4 oz (99.5 kg)   SpO2 98%   BMI 31.48 kg/m   Wt Readings from Last 3 Encounters:  08/27/18 219 lb 6.4 oz (99.5 kg)  05/21/18 220 lb (99.8 kg)  02/18/18 222 lb (100.7 kg)    Physical Exam  Constitutional: He is oriented to person, place, and time. He appears well-developed and well-nourished.  HENT:  Head: Normocephalic and atraumatic.  Eyes: Conjunctivae and EOM are normal.  Neck: Normal range of motion.  Cardiovascular: Normal rate, regular rhythm and normal heart sounds.  Pulmonary/Chest: Effort normal and breath sounds normal.  Musculoskeletal: Normal range of motion.  Neurological: He is alert and oriented to person, place, and time.  Skin: No erythema.  Psychiatric: He has a normal mood and  affect. His behavior is normal. Judgment and thought content normal.    Results for orders placed or performed in visit on 08/27/18  Bayer DCA Hb A1c Waived  Result Value Ref Range   HB A1C (BAYER DCA - WAIVED) 6.9 <7.0 %  Urine Microalbumin w/creat. ratio  Result Value Ref Range   Creatinine, Urine WILL FOLLOW    Microalbumin, Urine WILL FOLLOW    Microalb/Creat Ratio WILL FOLLOW       Assessment & Plan:   Problem List Items Addressed This Visit      Respiratory   Sleep apnea    The current medical regimen is effective;  continue present plan and medications.         Endocrine   Type 2 diabetes mellitus with diabetic neuropathy ([place]) - Primary    The current medical regimen is effective;  continue present plan and medications.       Relevant Orders   Bayer DCA Hb A1c Waived (Completed)   Urine Microalbumin w/creat. ratio (Completed)     Other   Hyperlipidemia    No medications currently       Other Visit Diagnoses    Need for influenza vaccination       Relevant Orders   Flu Vaccine QUAD 6+ mos PF IM (Fluarix Quad PF) (Completed)       Follow up plan: Return in about 3 months (around  11/27/2018) for Hemoglobin A1c, Physical Exam.

## 2018-08-27 NOTE — Patient Instructions (Signed)

## 2018-08-28 LAB — BAYER DCA HB A1C WAIVED: HB A1C: 6.9 % (ref <7.0)

## 2018-08-28 LAB — MICROALBUMIN, URINE WAIVED
Creatinine, Urine Waived: 50 mg/dL (ref 10–300)
Microalb, Ur Waived: 10 mg/L (ref 0–19)

## 2018-12-03 ENCOUNTER — Encounter: Payer: Self-pay | Admitting: Family Medicine

## 2018-12-03 ENCOUNTER — Ambulatory Visit (INDEPENDENT_AMBULATORY_CARE_PROVIDER_SITE_OTHER): Payer: BC Managed Care – PPO | Admitting: Family Medicine

## 2018-12-03 VITALS — BP 134/80 | HR 70 | Temp 97.7°F | Ht 70.0 in | Wt 214.6 lb

## 2018-12-03 DIAGNOSIS — E7800 Pure hypercholesterolemia, unspecified: Secondary | ICD-10-CM

## 2018-12-03 DIAGNOSIS — E1140 Type 2 diabetes mellitus with diabetic neuropathy, unspecified: Secondary | ICD-10-CM | POA: Diagnosis not present

## 2018-12-03 DIAGNOSIS — G4733 Obstructive sleep apnea (adult) (pediatric): Secondary | ICD-10-CM | POA: Diagnosis not present

## 2018-12-03 DIAGNOSIS — Z0000 Encounter for general adult medical examination without abnormal findings: Secondary | ICD-10-CM | POA: Diagnosis not present

## 2018-12-03 DIAGNOSIS — Z122 Encounter for screening for malignant neoplasm of respiratory organs: Secondary | ICD-10-CM

## 2018-12-03 LAB — URINALYSIS, ROUTINE W REFLEX MICROSCOPIC
Bilirubin, UA: NEGATIVE
Ketones, UA: NEGATIVE
LEUKOCYTES UA: NEGATIVE
Nitrite, UA: NEGATIVE
Protein, UA: NEGATIVE
RBC, UA: NEGATIVE
Specific Gravity, UA: 1.015 (ref 1.005–1.030)
Urobilinogen, Ur: 0.2 mg/dL (ref 0.2–1.0)
pH, UA: 7 (ref 5.0–7.5)

## 2018-12-03 LAB — BAYER DCA HB A1C WAIVED: HB A1C (BAYER DCA - WAIVED): 6.9 % (ref <7.0)

## 2018-12-03 MED ORDER — DAPAGLIFLOZIN PROPANEDIOL 10 MG PO TABS: 10.0000 mg | ORAL_TABLET | Freq: Every day | ORAL | 4 refills | Status: AC

## 2018-12-03 MED ORDER — METFORMIN HCL 1000 MG PO TABS: 1000.0000 mg | ORAL_TABLET | Freq: Every day | ORAL | 4 refills | Status: AC

## 2018-12-03 MED ORDER — ATORVASTATIN CALCIUM 10 MG PO TABS: 10.0000 mg | ORAL_TABLET | Freq: Every day | ORAL | 4 refills | Status: DC

## 2018-12-03 MED ORDER — GABAPENTIN 300 MG PO CAPS: 600.0000 mg | ORAL_CAPSULE | Freq: Two times a day (BID) | ORAL | 4 refills | Status: AC

## 2018-12-03 NOTE — Progress Notes (Signed)
BP 134/80   Pulse 70   Temp 97.7 F (36.5 C) (Oral)   Ht 5\' 10"  (1.778 m)   Wt 214 lb 9.6 oz (97.3 kg)   SpO2 96%   BMI 30.79 kg/m    Subjective:    Patient ID: Gerald Villegas, male    DOB: Sep 27, 1961, 58 y.o.   MRN: [id number]  HPI: ROCCO KERKHOFF is a 58 y.o. male  Chief Complaint  Patient presents with  . Annual Exam    pt states he is having left arm pain again, states his left hand stays cold all the time   . Diabetes  : Patient follow-up sleep apnea doing well no complaints no daytime drowsiness has been using his machine faithfully without any problems or issues. Has a very old machine and will probably need a new one. 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 Discussed cholesterol medicine and diabetes which is indicated for diabetics independent of cholesterol levels. Start lipitor  Relevant past medical, surgical, family and social history reviewed and updated as indicated. Interim medical history since our last visit reviewed. Allergies and medications reviewed and updated.  Review of Systems  Constitutional: Negative.   HENT: Negative.   Eyes: Negative.   Respiratory: Negative.   Cardiovascular: Negative.   Gastrointestinal: Negative.   Endocrine: Negative.   Genitourinary: Negative.   Musculoskeletal: Negative.   Skin: Negative.   Allergic/Immunologic: Negative.   Neurological: Negative.   Hematological: Negative.   Psychiatric/Behavioral: Negative.     Per HPI unless specifically indicated above     Objective:    BP 134/80   Pulse 70   Temp 97.7 F (36.5 C) (Oral)   Ht 5\' 10"  (1.778 m)   Wt 214 lb 9.6 oz (97.3 kg)   SpO2 96%   BMI 30.79 kg/m   Wt Readings from Last 3 Encounters:  12/03/18 214 lb 9.6 oz (97.3 kg)  08/27/18 219 lb 6.4 oz (99.5 kg)    05/21/18 220 lb (99.8 kg)    Physical Exam Constitutional:      Appearance: He is well-developed.  HENT:     Head: Normocephalic and atraumatic.     Right Ear: External ear normal.     Left Ear: External ear normal.  Eyes:     Conjunctiva/sclera: Conjunctivae normal.     Pupils: Pupils are equal, round, and reactive to light.  Neck:     Musculoskeletal: Normal range of motion and neck supple.  Cardiovascular:     Rate and Rhythm: Normal rate and regular rhythm.     Heart sounds: Normal heart sounds.  Pulmonary:     Effort: Pulmonary effort is normal.     Breath sounds: Normal breath sounds.  Abdominal:     General: Bowel sounds are normal.     Palpations: Abdomen is soft. There is no hepatomegaly or splenomegaly.  Genitourinary:    Penis: Normal.      Prostate: Normal.     Rectum: Normal.  Musculoskeletal: Normal range of motion.  Skin:    Findings: No erythema or rash.  Neurological:     Mental Status: He is alert and oriented to person, place, and time.     Deep Tendon Reflexes: Reflexes are normal and symmetric.  Psychiatric:  Behavior: Behavior normal.        Thought Content: Thought content normal.        Judgment: Judgment normal.     Results for orders placed or performed in visit on 08/27/18  Bayer DCA Hb A1c Waived  Result Value Ref Range   HB A1C (BAYER DCA - WAIVED) 6.9 <7.0 %  Urine Microalbumin w/creat. ratio  Result Value Ref Range   Creatinine, Urine WILL FOLLOW    Microalbumin, Urine WILL FOLLOW    Microalb/Creat Ratio WILL FOLLOW   Microalbumin, Urine Waived  Result Value Ref Range   Microalb, Ur Waived 10 0 - 19 mg/L   Creatinine, Urine Waived 50 10 - 300 mg/dL   Microalb/Creat Ratio 30-300 (H) <30 mg/g      Assessment & Plan:   Problem List Items Addressed This Visit      Respiratory   Sleep apnea    Sleep apnea well controlled with no problems uses mask and equipment faithfully no daytime drowsiness.        Endocrine    Type 2 diabetes mellitus with diabetic neuropathy ([place]) - Primary    The current medical regimen is effective;  continue present plan and medications.       Relevant Medications   metFORMIN (GLUCOPHAGE) 1000 MG tablet   dapagliflozin propanediol (FARXIGA) 10 MG TABS tablet   gabapentin (NEURONTIN) 300 MG capsule   atorvastatin (LIPITOR) 10 MG tablet   Other Relevant Orders   Bayer DCA Hb A1c Waived     Other   Hyperlipidemia    Will start atorvastation       Relevant Medications   atorvastatin (LIPITOR) 10 MG tablet    Other Visit Diagnoses    PE (physical exam), annual       Relevant Orders   CBC with Differential/Platelet   Comprehensive metabolic panel   Lipid panel   PSA   TSH   Urinalysis, Routine w reflex microscopic   Encounter for screening for lung cancer       Relevant Orders   CT CHEST LUNG CA SCREEN LOW DOSE W/O CM       Follow up plan: Return in about 6 months (around 06/03/2019) for Hemoglobin A1c, BMP,  Lipids, ALT, AST.

## 2018-12-03 NOTE — Assessment & Plan Note (Addendum)
Will start atorvastation

## 2018-12-03 NOTE — Assessment & Plan Note (Signed)
The current medical regimen is effective;  continue present plan and medications.  

## 2018-12-03 NOTE — Assessment & Plan Note (Signed)
Sleep apnea well controlled with no problems uses mask and equipment faithfully no daytime drowsiness.

## 2018-12-04 ENCOUNTER — Telehealth: Payer: Self-pay | Admitting: *Deleted

## 2018-12-04 ENCOUNTER — Encounter: Payer: Self-pay | Admitting: Family Medicine

## 2018-12-04 DIAGNOSIS — Z87891 Personal history of nicotine dependence: Secondary | ICD-10-CM

## 2018-12-04 DIAGNOSIS — Z122 Encounter for screening for malignant neoplasm of respiratory organs: Secondary | ICD-10-CM

## 2018-12-04 LAB — CBC WITH DIFFERENTIAL/PLATELET
BASOS ABS: 0.1 10*3/uL (ref 0.0–0.2)
Basos: 1 %
EOS (ABSOLUTE): 0.4 10*3/uL (ref 0.0–0.4)
EOS: 4 %
HEMOGLOBIN: 17.8 g/dL — AB (ref 13.0–17.7)
Hematocrit: 47.8 % (ref 37.5–51.0)
IMMATURE GRANULOCYTES: 0 %
Immature Grans (Abs): 0 10*3/uL (ref 0.0–0.1)
LYMPHS ABS: 3.5 10*3/uL — AB (ref 0.7–3.1)
Lymphs: 38 %
MCH: 32.9 pg (ref 26.6–33.0)
MCHC: 37.2 g/dL — AB (ref 31.5–35.7)
MCV: 88 fL (ref 79–97)
MONOS ABS: 0.4 10*3/uL (ref 0.1–0.9)
Monocytes: 5 %
NEUTROS PCT: 52 %
Neutrophils Absolute: 4.8 10*3/uL (ref 1.4–7.0)
PLATELETS: 200 10*3/uL (ref 150–450)
RBC: 5.41 x10E6/uL (ref 4.14–5.80)
RDW: 12.3 % (ref 11.6–15.4)
WBC: 9.2 10*3/uL (ref 3.4–10.8)

## 2018-12-04 LAB — COMPREHENSIVE METABOLIC PANEL
ALT: 11 IU/L (ref 0–44)
AST: 16 IU/L (ref 0–40)
Albumin/Globulin Ratio: 2.3 — ABNORMAL HIGH (ref 1.2–2.2)
Albumin: 4.9 g/dL (ref 3.8–4.9)
Alkaline Phosphatase: 54 IU/L (ref 39–117)
BILIRUBIN TOTAL: 0.4 mg/dL (ref 0.0–1.2)
BUN/Creatinine Ratio: 15 (ref 9–20)
BUN: 13 mg/dL (ref 6–24)
CHLORIDE: 101 mmol/L (ref 96–106)
CO2: 22 mmol/L (ref 20–29)
CREATININE: 0.84 mg/dL (ref 0.76–1.27)
Calcium: 9.8 mg/dL (ref 8.7–10.2)
GFR calc Af Amer: 114 mL/min/{1.73_m2} (ref >59)
GFR calc non Af Amer: 99 mL/min/{1.73_m2} (ref >59)
GLUCOSE: 178 mg/dL — AB (ref 65–99)
Globulin, Total: 2.1 g/dL (ref 1.5–4.5)
Potassium: 4.3 mmol/L (ref 3.5–5.2)
Sodium: 139 mmol/L (ref 134–144)
Total Protein: 7 g/dL (ref 6.0–8.5)

## 2018-12-04 LAB — LIPID PANEL
CHOLESTEROL TOTAL: 142 mg/dL (ref 100–199)
Chol/HDL Ratio: 5.9 ratio — ABNORMAL HIGH (ref 0.0–5.0)
HDL: 24 mg/dL — ABNORMAL LOW (ref >39)
LDL CALC: 41 mg/dL (ref 0–99)
TRIGLYCERIDES: 384 mg/dL — AB (ref 0–149)
VLDL CHOLESTEROL CAL: 77 mg/dL — AB (ref 5–40)

## 2018-12-04 LAB — TSH: TSH: 3.88 u[IU]/mL (ref 0.450–4.500)

## 2018-12-04 LAB — PSA: Prostate Specific Ag, Serum: 0.2 ng/mL (ref 0.0–4.0)

## 2018-12-04 NOTE — Telephone Encounter (Signed)
Received referral for initial lung cancer screening scan. Contacted patient and obtained smoking history,(current, 70 pack year) as well as answering questions related to screening process. Patient denies signs of lung cancer such as weight loss or hemoptysis. Patient denies comorbidity that would prevent curative treatment if lung cancer were found. Patient is scheduled for shared decision making visit and CT scan on 12/22/18 at 3:30pm.

## 2018-12-19 ENCOUNTER — Telehealth: Payer: Self-pay | Admitting: *Deleted

## 2018-12-19 NOTE — Telephone Encounter (Signed)
Called pt to remind him of his appt for ldct screening on 12-22-2018@1530 , message left for patient

## 2018-12-22 ENCOUNTER — Inpatient Hospital Stay: Payer: BC Managed Care – PPO | Attending: Nurse Practitioner | Admitting: Oncology

## 2018-12-22 ENCOUNTER — Ambulatory Visit
Admission: RE | Admit: 2018-12-22 | Discharge: 2018-12-22 | Disposition: A | Discharge status: Home / Self Care | Payer: BC Managed Care – PPO | Source: Ambulatory Visit | Attending: Nurse Practitioner | Admitting: Nurse Practitioner

## 2018-12-22 DIAGNOSIS — Z720 Tobacco use: Secondary | ICD-10-CM | POA: Diagnosis not present

## 2018-12-22 DIAGNOSIS — Z87891 Personal history of nicotine dependence: Secondary | ICD-10-CM | POA: Diagnosis present

## 2018-12-22 DIAGNOSIS — Z122 Encounter for screening for malignant neoplasm of respiratory organs: Secondary | ICD-10-CM | POA: Insufficient documentation

## 2018-12-22 NOTE — Progress Notes (Signed)
In accordance with CMS guidelines, patient has met eligibility criteria including age, absence of signs or symptoms of lung cancer.  Social History   Tobacco Use  . Smoking status: Current Every Day Smoker    Packs/day: 2.00    Years: 35.00    Pack years: 70.00    Types: Cigarettes  . Smokeless tobacco: Never Used  Substance Use Topics  . Alcohol use: No  . Drug use: No     A shared decision-making session was conducted prior to the performance of CT scan. This includes one or more decision aids, includes benefits and harms of screening, follow-up diagnostic testing, over-diagnosis, false positive rate, and total radiation exposure.  Counseling on the importance of adherence to annual lung cancer LDCT screening, impact of co-morbidities, and ability or willingness to undergo diagnosis and treatment is imperative for compliance of the program.  Counseling on the importance of continued smoking cessation for former smokers; the importance of smoking cessation for current smokers, and information about tobacco cessation interventions have been given to patient including [place] and 1800 quit [place] programs.  Written order for lung cancer screening with LDCT has been given to the patient and any and all questions have been answered to the best of my abilities.   Yearly follow up will be coordinated by Burgess Estelle, Thoracic Navigator.  Faythe Casa, NP 12/22/2018 3:52 PM

## 2018-12-23 ENCOUNTER — Encounter: Payer: Self-pay | Admitting: *Deleted

## 2018-12-31 ENCOUNTER — Encounter: Payer: Self-pay | Admitting: *Deleted

## 2019-06-03 ENCOUNTER — Ambulatory Visit: Payer: BC Managed Care – PPO | Admitting: Family Medicine

## 2019-06-17 ENCOUNTER — Ambulatory Visit (INDEPENDENT_AMBULATORY_CARE_PROVIDER_SITE_OTHER): Payer: BC Managed Care – PPO | Admitting: Family Medicine

## 2019-06-17 ENCOUNTER — Encounter: Payer: Self-pay | Admitting: Family Medicine

## 2019-06-17 ENCOUNTER — Other Ambulatory Visit: Payer: Self-pay

## 2019-06-17 DIAGNOSIS — G4733 Obstructive sleep apnea (adult) (pediatric): Secondary | ICD-10-CM | POA: Diagnosis not present

## 2019-06-17 DIAGNOSIS — E7800 Pure hypercholesterolemia, unspecified: Secondary | ICD-10-CM

## 2019-06-17 DIAGNOSIS — E1140 Type 2 diabetes mellitus with diabetic neuropathy, unspecified: Secondary | ICD-10-CM

## 2019-06-17 DIAGNOSIS — Z716 Tobacco abuse counseling: Secondary | ICD-10-CM | POA: Diagnosis not present

## 2019-06-17 NOTE — Assessment & Plan Note (Signed)
The current medical regimen is effective;  continue present plan and medications.  

## 2019-06-17 NOTE — Progress Notes (Addendum)
There were no vitals taken for this visit.   Subjective:    Patient ID: Gerald Villegas, male    DOB: [birth year], 58 y.o.   MRN: [id number]  HPI: Gerald Villegas is a 58 y.o. male  Med check Discussed medical issues patient doing well no diabetes complaints taking diabetes medications faithfully without problems. Cholesterol doing well with no complaints. Diabetic neuropathy doing well with gabapentin. Discussed smoking cessation. Uses his CPAP faithfully with no problems and good resolution of daytime drowsiness  Relevant past medical, surgical, family and social history reviewed and updated as indicated. Interim medical history since our last visit reviewed. Allergies and medications reviewed and updated.  Review of Systems  Constitutional: Negative.   Respiratory: Negative.   Cardiovascular: Negative.     Per HPI unless specifically indicated above     Objective:    There were no vitals taken for this visit.  Wt Readings from Last 3 Encounters:  12/22/18 219 lb (99.3 kg)  12/03/18 214 lb 9.6 oz (97.3 kg)  08/27/18 219 lb 6.4 oz (99.5 kg)    Physical Exam  Results for orders placed or performed in visit on 06/17/19  Bayer DCA Hb A1c Waived  Result Value Ref Range   HB A1C (BAYER DCA - WAIVED) 7.1 (H) <0.5 %  Basic metabolic panel  Result Value Ref Range   Glucose 248 (H) 65 - 99 mg/dL   BUN 14 6 - 24 mg/dL   Creatinine, Ser 0.91 0.76 - 1.27 mg/dL   GFR calc non Af Amer 95 >59 mL/min/1.73   GFR calc Af Amer 109 >59 mL/min/1.73   BUN/Creatinine Ratio 15 9 - 20   Sodium 137 134 - 144 mmol/L   Potassium 4.5 3.5 - 5.2 mmol/L   Chloride 97 96 - 106 mmol/L   CO2 22 20 - 29 mmol/L   Calcium 9.7 8.7 - 10.2 mg/dL  Lipid Panel w/o Chol/HDL Ratio  Result Value Ref Range   Cholesterol, Total 134 100 - 199 mg/dL   Triglycerides 472 (H) 0 - 149 mg/dL   HDL 27 (L) >39 mg/dL   VLDL Cholesterol Cal Comment 5 - 40 mg/dL   LDL Calculated Comment 0 - 99 mg/dL       Assessment & Plan:   Problem List Items Addressed This Visit      Respiratory   Sleep apnea    Doing well, no daytime drowsiness has faithful use of his CPAP.  His CPAP machine is probably 56 years old wants to get a new one.      Relevant Orders   Basic metabolic panel (Completed)     Endocrine   Type 2 diabetes mellitus with diabetic neuropathy ([place])    The current medical regimen is effective;  continue present plan and medications.       Relevant Orders   Bayer DCA Hb A1c Waived (Completed)   Basic metabolic panel (Completed)     Other   Hyperlipidemia    The current medical regimen is effective;  continue present plan and medications.       Relevant Orders   Basic metabolic panel (Completed)   LP+ALT+AST [place], Waived   Lipid Panel w/o Chol/HDL Ratio (Completed)   Encounter for smoking cessation counseling    Discussed smoking cessation counseling options for medications and encouraged          Follow up plan: Return in about 6 months (around 12/18/2019) for Physical Exam, Hemoglobin A1c.

## 2019-06-17 NOTE — Assessment & Plan Note (Addendum)
Doing well, no daytime drowsiness has faithful use of his CPAP.  His CPAP machine is probably 56 years old wants to get a new one.

## 2019-06-17 NOTE — Assessment & Plan Note (Signed)
Discussed smoking cessation counseling options for medications and encouraged

## 2019-06-18 ENCOUNTER — Other Ambulatory Visit: Payer: BC Managed Care – PPO

## 2019-06-18 ENCOUNTER — Other Ambulatory Visit: Payer: Self-pay

## 2019-06-18 LAB — BAYER DCA HB A1C WAIVED: HB A1C (BAYER DCA - WAIVED): 7.1 % — ABNORMAL HIGH (ref <7.0)

## 2019-06-18 NOTE — Addendum Note (Signed)
Addended by: Jerene Pitch on: 06/18/2019 03:52 PM   Modules accepted: Orders

## 2019-06-19 ENCOUNTER — Encounter: Payer: Self-pay | Admitting: Family Medicine

## 2019-06-19 LAB — BASIC METABOLIC PANEL
BUN/Creatinine Ratio: 15 (ref 9–20)
BUN: 14 mg/dL (ref 6–24)
CO2: 22 mmol/L (ref 20–29)
Calcium: 9.7 mg/dL (ref 8.7–10.2)
Chloride: 97 mmol/L (ref 96–106)
Creatinine, Ser: 0.91 mg/dL (ref 0.76–1.27)
GFR calc Af Amer: 109 mL/min/{1.73_m2} (ref >59)
GFR calc non Af Amer: 95 mL/min/{1.73_m2} (ref >59)
Glucose: 248 mg/dL — ABNORMAL HIGH (ref 65–99)
Potassium: 4.5 mmol/L (ref 3.5–5.2)
Sodium: 137 mmol/L (ref 134–144)

## 2019-06-19 LAB — LIPID PANEL W/O CHOL/HDL RATIO
Cholesterol, Total: 134 mg/dL (ref 100–199)
HDL: 27 mg/dL — ABNORMAL LOW (ref >39)
Triglycerides: 472 mg/dL — ABNORMAL HIGH (ref 0–149)

## 2019-07-28 ENCOUNTER — Telehealth: Payer: Self-pay

## 2019-07-28 NOTE — Telephone Encounter (Signed)
Called and left a message asking patient to return, I checked his chart and our folders and did not see any paperwork.  Copied from [place] [id number]. Topic: General - Inquiry >> Jul 28, 2019  9:53 AM Sheran Luz wrote: Patient calling to inquire if form for employer that he dropped off at office last month has been sent off. Patient is requesting a call back from office staff to discuss further. It is a form for coding for insurance through the state.

## 2019-07-29 NOTE — Telephone Encounter (Signed)
Paper placed in Dr.Crissman's folder.   Dr.Crissman, please update patient's last office visit with the codes, where you discussed tobacco cessation.

## 2019-07-29 NOTE — Telephone Encounter (Signed)
Form received, will speak with Dr.Crissman about this.

## 2019-07-30 NOTE — Telephone Encounter (Signed)
I went back and added the code

## 2019-07-30 NOTE — Telephone Encounter (Signed)
Patient notified

## 2019-08-05 ENCOUNTER — Other Ambulatory Visit: Payer: Self-pay | Admitting: Family Medicine

## 2019-08-23 IMAGING — CT CT CHEST LUNG CANCER SCREENING LOW DOSE W/O CM
2 of 5 series · 15 of 40 positions shown, 18 images · non-contrast
Comparison: None.

CLINICAL DATA: 57-year-old asymptomatic male current smoker with 70
pack-year smoking history.

EXAM:
CT CHEST WITHOUT CONTRAST LOW-DOSE FOR LUNG CANCER SCREENING
TECHNIQUE: Multidetector CT imaging of the chest was performed following the
standard protocol without IV contrast.

[Series 3: lung · axial · 0.78mm/px · z∈[-1233,-927]mm · 12 of 340 slices shown, 15 images (1 of 2)]
[im 17/340  mediastinal]
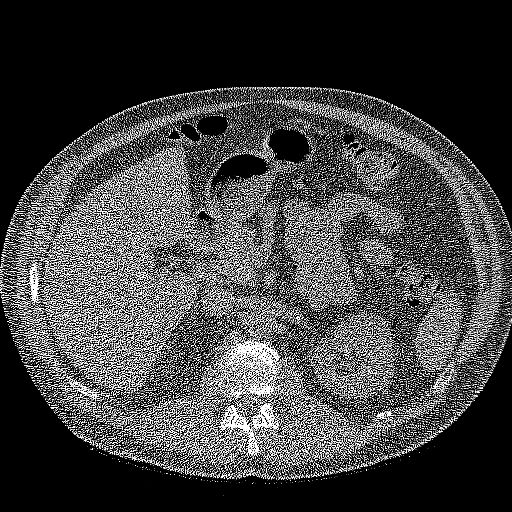
[im 17/340  lung]
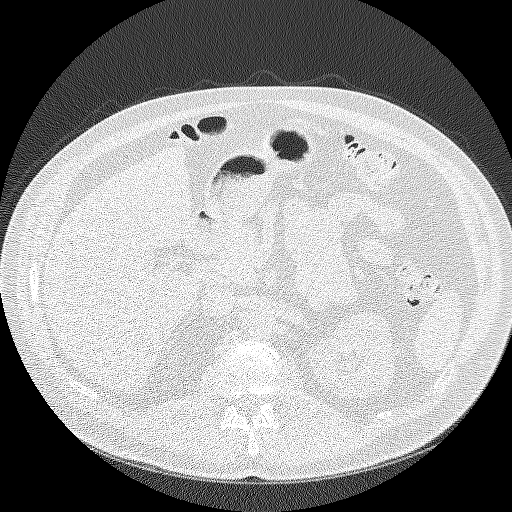
[im 49/340  lung]
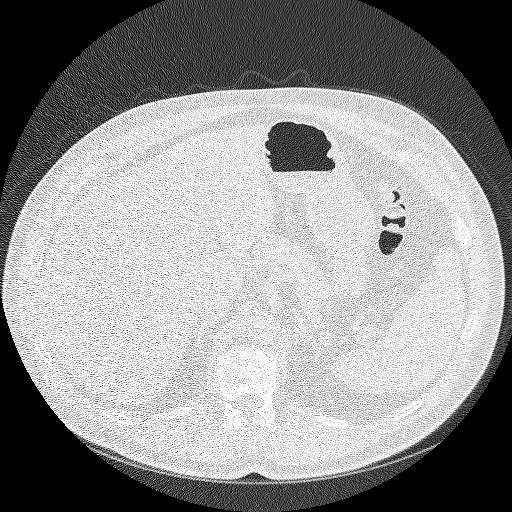
[im 81/340  lung]
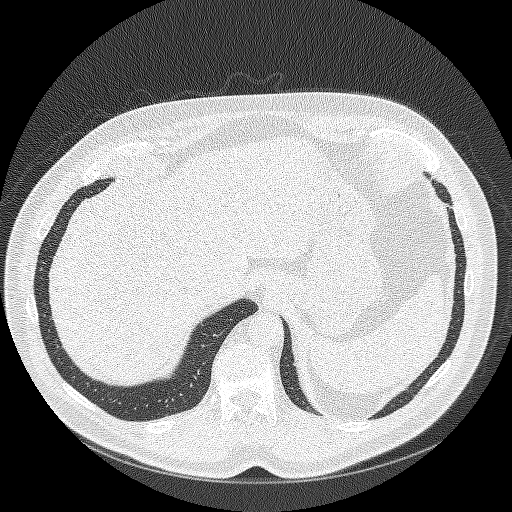
[im 97/340  lung]
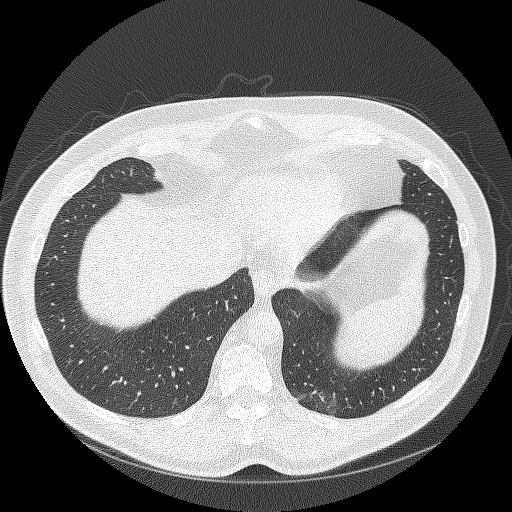
[im 130/340  mediastinal]
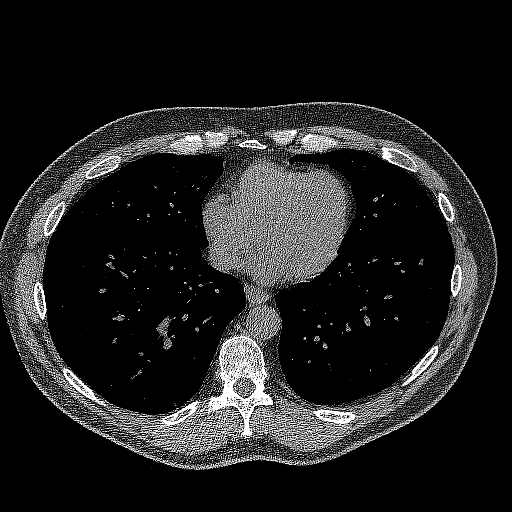
[im 130/340  lung]
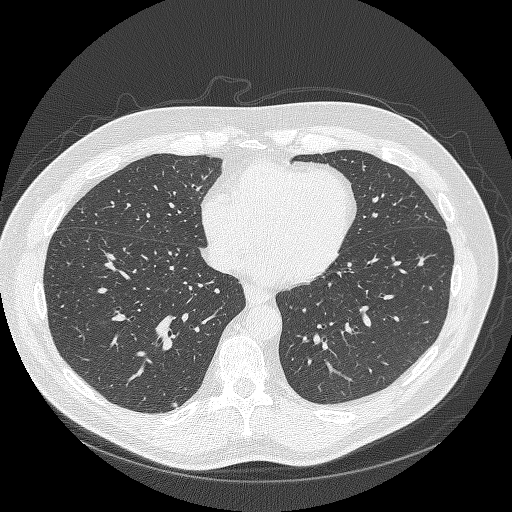
[im 162/340  lung]
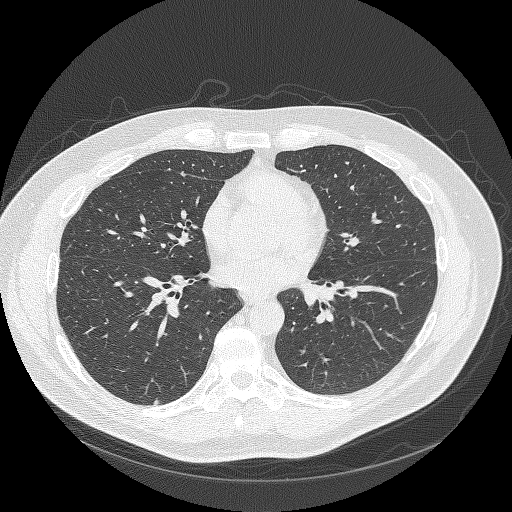
[im 178/340  lung]
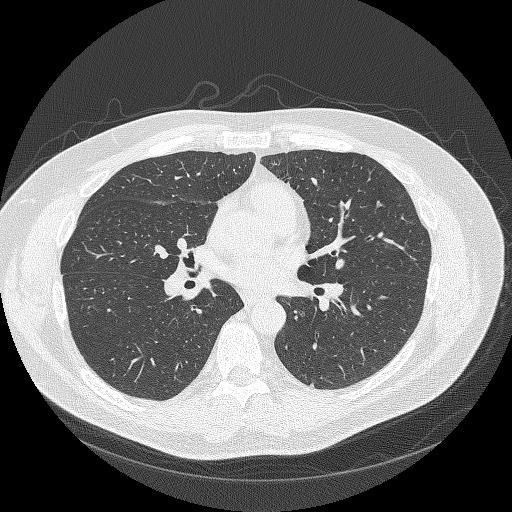
[im 210/340  lung]
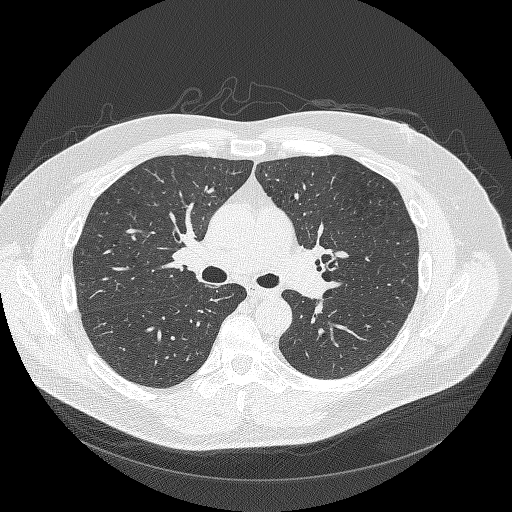
[im 243/340  mediastinal]
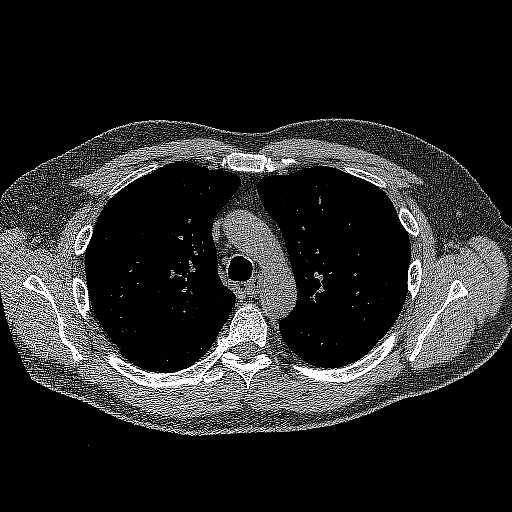
[im 243/340  lung]
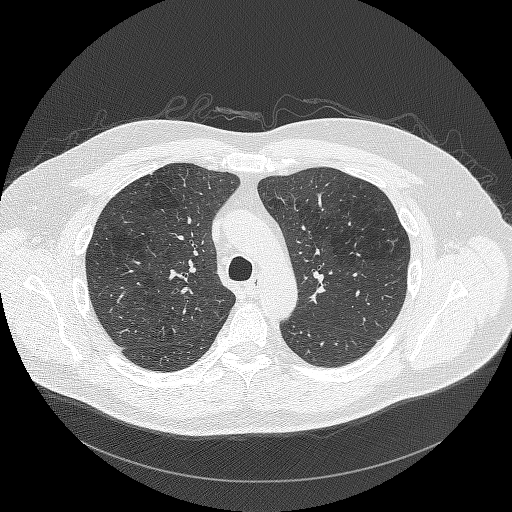
[im 259/340  lung]
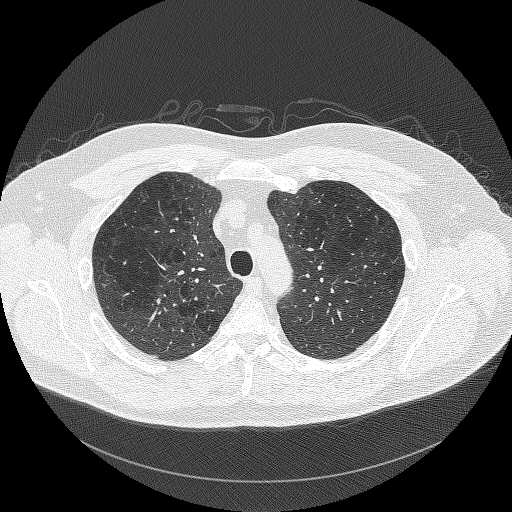
[im 291/340  lung]
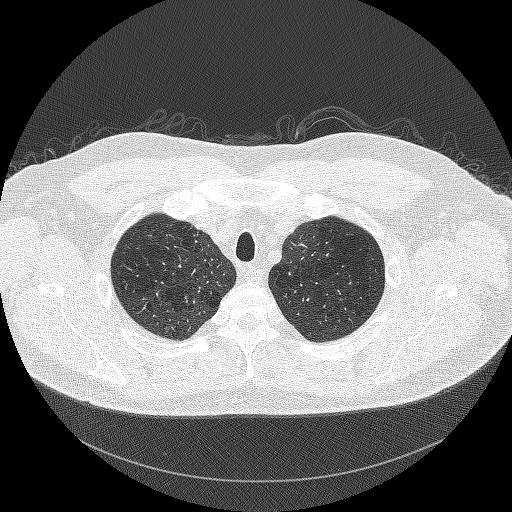
[im 323/340  lung]
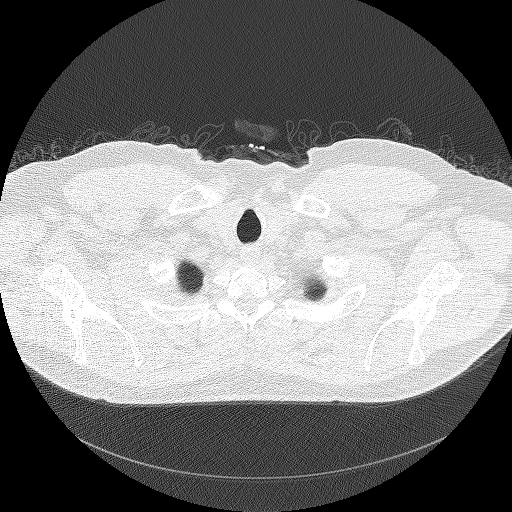

[Series 4: lung · coronal · 0.66mm/px · 3 of 314 slices shown (2 of 2)]
[im 63/314  lung]
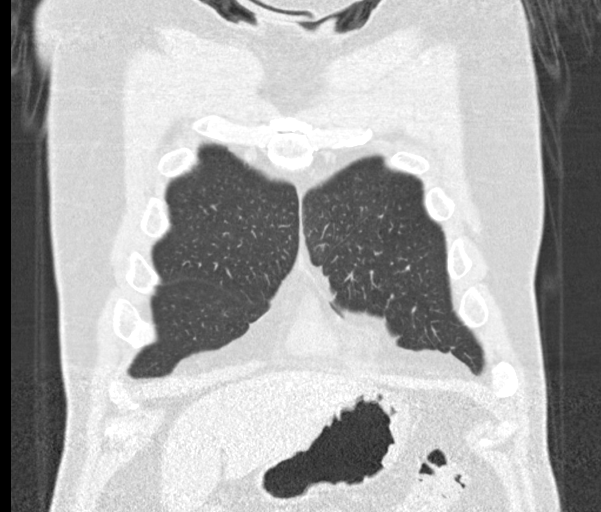
[im 126/314  lung]
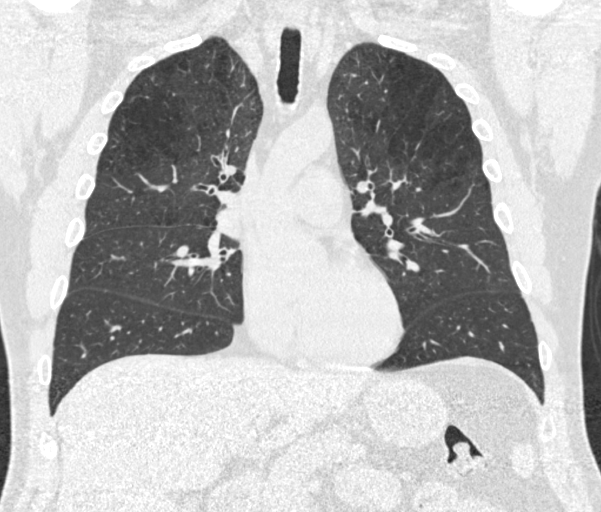
[im 188/314  lung]
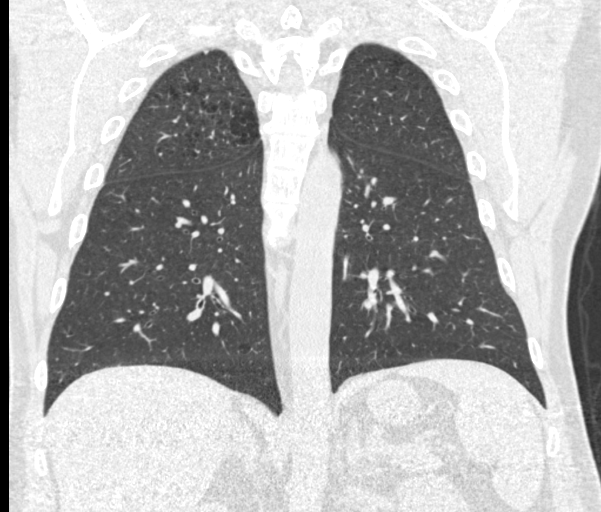

[15 of 40 positions shown; findings below may reference images not displayed]

FINDINGS: Cardiovascular: Normal heart size. No significant pericardial
effusion/thickening. Left anterior descending and right coronary
atherosclerosis. Great vessels are normal in course and caliber.

Mediastinum/Nodes: No discrete thyroid nodules. Unremarkable
esophagus. No pathologically enlarged axillary, mediastinal or hilar
lymph nodes, noting limited sensitivity for the detection of hilar
adenopathy on this noncontrast study.

Lungs/Pleura: No pneumothorax. No pleural effusion. Moderate
centrilobular emphysema with mild diffuse bronchial wall thickening.
No acute consolidative airspace disease or lung masses. Several
solid pulmonary nodules scattered in both lungs, largest 5.1 mm in
volume derived mean diameter (series 3/image 212).

Upper abdomen: No acute abnormality.

Musculoskeletal:  No aggressive appearing focal osseous lesions.
IMPRESSION: 1. Lung-RADS 2, benign appearance or behavior. Continue annual
screening with low-dose chest CT without contrast in 12 months.
2. Two-vessel coronary atherosclerosis.

Aortic Atherosclerosis (MBFJ9-1FU.U) and Emphysema (MBFJ9-KP2.J).

## 2019-09-22 ENCOUNTER — Encounter: Payer: Self-pay | Admitting: Family Medicine

## 2019-10-12 ENCOUNTER — Ambulatory Visit
Admission: EM | Admit: 2019-10-12 | Discharge: 2019-10-12 | Disposition: A | Discharge status: Home / Self Care | Payer: BC Managed Care – PPO

## 2019-10-12 ENCOUNTER — Other Ambulatory Visit: Payer: Self-pay

## 2019-10-12 DIAGNOSIS — Z0189 Encounter for other specified special examinations: Secondary | ICD-10-CM | POA: Diagnosis not present

## 2019-10-12 DIAGNOSIS — Z20828 Contact with and (suspected) exposure to other viral communicable diseases: Secondary | ICD-10-CM | POA: Diagnosis not present

## 2019-10-12 DIAGNOSIS — R0981 Nasal congestion: Secondary | ICD-10-CM

## 2019-10-12 DIAGNOSIS — R067 Sneezing: Secondary | ICD-10-CM | POA: Diagnosis not present

## 2019-10-12 NOTE — ED Triage Notes (Signed)
[facility] for [facility] test. Pt was exposed to a positive covid case 10 days ago. Pt states he was sneezing 3 days ago.

## 2019-10-12 NOTE — Discharge Instructions (Addendum)
Your COVID test is pending.  You should self quarantine until your test result is back and is negative.   ° °Go to the emergency department if you develop high fever, shortness of breath, severe diarrhea, or other concerning symptoms.   ° °

## 2019-10-12 NOTE — ED Provider Notes (Addendum)
Gerald Villegas    CSN: [id number] Arrival date & time: 10/12/19  1014      History   Chief Complaint Chief Complaint  Patient presents with  . Covid test    HPI Gerald Villegas is a 58 y.o. male.   Patient presents with request for a COVID test.  He reports exposure at home from 2 positive family members 10 days ago.  He states he was asymptomatic until 10/09/2019 and on that day he had sneezing and nasal congestion.  The symptoms cleared up the next day and he has been asymptomatic since.  He denies fever, chills, cough, shortness of breath, vomiting, diarrhea, or other symptoms.  No treatments attempted at home.  The history is provided by the patient.    Past Medical History:  Diagnosis Date  . Arthritis    knuckles/knees  . Cough    chest congestion for pasr month,zpack, recovering  . Diabetes mellitus without complication ([place])    type 2  . HOH (hard of hearing)   . Hyperlipidemia   . Neuromuscular disorder (HCC)    neuropathy feet  . Sleep apnea    bi-pap for 14 yrs  . Vertigo    hx of 6 yrs ago/ fast movements    Patient Active Problem List   Diagnosis Date Noted  . Encounter for smoking cessation counseling 06/17/2019  . Vertigo 07/24/2017  . Onychomycosis 11/12/2016  . Sleep apnea 07/27/2015  . Type 2 diabetes mellitus with diabetic neuropathy ([place]) 04/21/2015  . Hyperlipidemia     Past Surgical History:  Procedure Laterality Date  . APPENDECTOMY  2013  . COLONOSCOPY WITH PROPOFOL N/A 04/20/2016   Procedure: COLONOSCOPY WITH PROPOFOL;  Surgeon: Lucilla Lame, MD;  Location: [facility];  Service: Endoscopy;  Laterality: N/A;  Diabetic-oral med  . PILONIDAL CYST EXCISION  1997  . POLYPECTOMY  04/20/2016   Procedure: POLYPECTOMY;  Surgeon: Lucilla Lame, MD;  Location: [facility];  Service: Endoscopy;;  . SHOULDER SURGERY Right 08/22/11   Dr. Jefm Bryant       Home Medications    Prior to Admission medications   Medication Sig  Start Date End Date Taking? Authorizing Provider  amitriptyline (ELAVIL) 25 MG tablet Take 25 mg by mouth at bedtime. 10/01/19   [provider]  aspirin EC 81 MG tablet Take 81 mg by mouth daily. pm    [provider]  atorvastatin (LIPITOR) 10 MG tablet Take 1 tablet (10 mg total) by mouth daily. 12/03/18   Guadalupe Maple, MD  dapagliflozin propanediol (FARXIGA) 10 MG TABS tablet Take 10 mg by mouth daily. 12/03/18   Guadalupe Maple, MD  fluticasone (FLONASE) 50 MCG/ACT nasal spray USE 2 SPRAYS IN EACH NOSTRIL ONCE DAILY. 04/24/18   Guadalupe Maple, MD  gabapentin (NEURONTIN) 300 MG capsule Take 2 capsules (600 mg total) by mouth 2 (two) times daily. 12/03/18   Guadalupe Maple, MD  losartan (COZAAR) 50 MG tablet Take 50 mg by mouth daily. 10/01/19   [provider]  metFORMIN (GLUCOPHAGE) 1000 MG tablet Take 1 tablet (1,000 mg total) by mouth daily. 12/03/18   Guadalupe Maple, MD    Family History Family History  Problem Relation Age of Onset  . Heart disease Father   . Diabetes Father   . Hypertension Father   . Cancer Paternal Uncle   . Diabetes Maternal Grandfather   . Heart disease Paternal Grandfather     Social History Social History  Tobacco Use  . Smoking status: Current Every Day Smoker    Packs/day: 2.00    Years: 35.00    Pack years: 70.00    Types: Cigarettes  . Smokeless tobacco: Never Used  Substance Use Topics  . Alcohol use: No  . Drug use: No     Allergies   Patient has no known allergies.   Review of Systems Review of Systems  Constitutional: Negative for chills and fever.  HENT: Positive for congestion and sneezing. Negative for ear pain and sore throat.   Eyes: Negative for pain and visual disturbance.  Respiratory: Negative for cough and shortness of breath.   Cardiovascular: Negative for chest pain and palpitations.  Gastrointestinal: Negative for abdominal pain, diarrhea and vomiting.  Genitourinary: Negative for  dysuria and hematuria.  Musculoskeletal: Negative for arthralgias and back pain.  Skin: Negative for color change and rash.  Neurological: Negative for seizures and syncope.  All other systems reviewed and are negative.    Physical Exam Triage Vital Signs ED Triage Vitals  Enc Vitals Group     BP      Pulse      Resp      Temp      Temp src      SpO2      Weight      Height      Head Circumference      Peak Flow      Pain Score      Pain Loc      Pain Edu?      Excl. in [place]?    No data found.  Updated Vital Signs BP 126/83 (BP Location: Left Arm)   Pulse 87   Temp 98.6 F (37 C) (Temporal)   Resp 16   SpO2 97%   Visual Acuity Right Eye Distance:   Left Eye Distance:   Bilateral Distance:    Right Eye Near:   Left Eye Near:    Bilateral Near:     Physical Exam Vitals and nursing note reviewed.  Constitutional:      General: He is not in acute distress.    Appearance: He is well-developed. He is not ill-appearing.  HENT:     Head: Normocephalic and atraumatic.     Right Ear: Tympanic membrane normal.     Left Ear: Tympanic membrane normal.     Nose: Nose normal.     Mouth/Throat:     Mouth: Mucous membranes are moist.     Pharynx: Oropharynx is clear.  Eyes:     Conjunctiva/sclera: Conjunctivae normal.  Cardiovascular:     Rate and Rhythm: Normal rate and regular rhythm.     Heart sounds: No murmur.  Pulmonary:     Effort: Pulmonary effort is normal. No respiratory distress.     Breath sounds: Normal breath sounds.  Abdominal:     General: Bowel sounds are normal.     Palpations: Abdomen is soft.     Tenderness: There is no abdominal tenderness. There is no guarding or rebound.  Musculoskeletal:     Cervical back: Neck supple.  Skin:    General: Skin is warm and dry.     Findings: No rash.  Neurological:     General: No focal deficit present.     Mental Status: He is alert and oriented to person, place, and time.  Psychiatric:        Mood  and Affect: Mood normal.  Behavior: Behavior normal.      UC Treatments / Results  Labs (all labs ordered are listed, but only abnormal results are displayed) Labs Reviewed  NOVEL CORONAVIRUS, NAA    EKG   Radiology No results found.  Procedures Procedures (including critical care time)  Medications Ordered in UC Medications - No data to display  Initial Impression / Assessment and Plan / UC Course  I have reviewed the triage vital signs and the nursing notes.  Pertinent labs & imaging results that were available during my care of the patient were reviewed by me and considered in my medical decision making (see chart for details).    Patient request for COVID test; sneezing.  He is well-appearing and currently asymptomatic.  COVID test performed here.  Instructed patient to self quarantine until the test result is back.  Instructed patient to go to the emergency department if he develops high fever, shortness of breath, severe diarrhea, or other concerning symptoms.  Patient agrees with plan of care.     Final Clinical Impressions(s) / UC Diagnoses   Final diagnoses:  Patient request for diagnostic testing  Sneezing     Discharge Instructions     Your COVID test is pending.  You should self quarantine until your test result is back and is negative.    Go to the emergency department if you develop high fever, shortness of breath, severe diarrhea, or other concerning symptoms.       ED Prescriptions    None     PDMP not reviewed this encounter.   Sharion Balloon, NP 10/12/19 [place], NP 10/12/19 1044

## 2019-10-14 LAB — NOVEL CORONAVIRUS, NAA: SARS-CoV-2, NAA: NOT DETECTED

## 2019-12-10 ENCOUNTER — Telehealth: Payer: Self-pay | Admitting: *Deleted

## 2019-12-10 NOTE — Telephone Encounter (Signed)
(  12/10/19) Left message for patient to notify them that it is time to schedule annual low dose lung cancer screening CT scan. Instructed patient to call back to verify information prior to the scan being scheduled SRW     

## 2019-12-18 ENCOUNTER — Telehealth: Payer: Self-pay | Admitting: *Deleted

## 2019-12-18 DIAGNOSIS — Z87891 Personal history of nicotine dependence: Secondary | ICD-10-CM

## 2019-12-18 NOTE — Telephone Encounter (Signed)
Patient has been notified that annual lung cancer screening low dose CT scan is due currently or will be in near future. Confirmed that patient is within the age range of 55-77, and asymptomatic, (no signs or symptoms of lung cancer). Patient denies illness that would prevent curative treatment for lung cancer if found. Verified smoking history, (current, 72 pack year). The shared decision making visit was done 12/22/18. Patient is agreeable for CT scan being scheduled.

## 2019-12-25 ENCOUNTER — Ambulatory Visit
Admission: RE | Admit: 2019-12-25 | Discharge: 2019-12-25 | Disposition: A | Discharge status: Home / Self Care | Payer: BC Managed Care – PPO | Source: Ambulatory Visit | Attending: Nurse Practitioner | Admitting: Nurse Practitioner

## 2019-12-25 ENCOUNTER — Other Ambulatory Visit: Payer: Self-pay

## 2019-12-25 DIAGNOSIS — Z87891 Personal history of nicotine dependence: Secondary | ICD-10-CM | POA: Diagnosis present

## 2019-12-30 ENCOUNTER — Encounter: Payer: Self-pay | Admitting: *Deleted

## 2020-12-21 ENCOUNTER — Telehealth: Payer: Self-pay | Admitting: *Deleted

## 2020-12-21 NOTE — Telephone Encounter (Signed)
Attempted to contact and schedule lung screening scan. Message left for patient to call back to schedule. 

## 2020-12-28 ENCOUNTER — Telehealth: Payer: Self-pay | Admitting: *Deleted

## 2020-12-28 NOTE — Telephone Encounter (Signed)
Called patient on his cell phone which is the only number on file.  Got voicemail and left a message that we will need to follow-up with him if he wanted to do another yearly lung screening scan at the outpatient [facility].  He gets this message if he can give [place] a call at [phone] make sure we ask him if you have questions and after that would be able to set him up for his next appointment for a lung screening scan.

## 2020-12-29 ENCOUNTER — Telehealth: Payer: Self-pay | Admitting: *Deleted

## 2020-12-29 NOTE — Telephone Encounter (Signed)
Reviewed chart. Judeen Hammans, RN attempted to reach patient yesterday to set up lung ct scan apts.   I sent a mychart message to patient to request patient to call our office to set up the scan.

## 2020-12-30 ENCOUNTER — Telehealth: Payer: Self-pay

## 2020-12-30 NOTE — Telephone Encounter (Signed)
12/30/20 left voicemail reminding pt to call and get scheduled for his annual lung screening callback number [phone]

## 2021-01-05 ENCOUNTER — Telehealth: Payer: Self-pay

## 2021-01-05 NOTE — Telephone Encounter (Signed)
01/05/2021- Patient called back he is scheduled for 3/17 @ 2 pm . Has no ins changes, currently smokes one pack per. Denies any other cancer or  Treatments. [facility]

## 2021-01-06 ENCOUNTER — Other Ambulatory Visit: Payer: Self-pay | Admitting: *Deleted

## 2021-01-06 DIAGNOSIS — Z87891 Personal history of nicotine dependence: Secondary | ICD-10-CM

## 2021-01-06 DIAGNOSIS — Z122 Encounter for screening for malignant neoplasm of respiratory organs: Secondary | ICD-10-CM

## 2021-01-06 DIAGNOSIS — F17200 Nicotine dependence, unspecified, uncomplicated: Secondary | ICD-10-CM

## 2021-01-06 NOTE — Progress Notes (Signed)
Contacted and scheduled for annual lung screening scan. Patient is a current smoker with a 73 pack year history.

## 2021-01-12 ENCOUNTER — Other Ambulatory Visit: Payer: Self-pay

## 2021-01-12 ENCOUNTER — Ambulatory Visit
Admission: RE | Admit: 2021-01-12 | Discharge: 2021-01-12 | Disposition: A | Discharge status: Home / Self Care | Payer: BC Managed Care – PPO | Source: Ambulatory Visit | Attending: Nurse Practitioner | Admitting: Nurse Practitioner

## 2021-01-12 DIAGNOSIS — F17200 Nicotine dependence, unspecified, uncomplicated: Secondary | ICD-10-CM | POA: Diagnosis present

## 2021-01-12 DIAGNOSIS — Z87891 Personal history of nicotine dependence: Secondary | ICD-10-CM | POA: Diagnosis not present

## 2021-01-12 DIAGNOSIS — Z122 Encounter for screening for malignant neoplasm of respiratory organs: Secondary | ICD-10-CM | POA: Diagnosis present

## 2021-01-20 ENCOUNTER — Encounter: Payer: Self-pay | Admitting: *Deleted

## 2021-11-16 ENCOUNTER — Other Ambulatory Visit: Payer: Self-pay | Admitting: Internal Medicine

## 2021-11-16 DIAGNOSIS — R0781 Pleurodynia: Secondary | ICD-10-CM

## 2021-11-16 DIAGNOSIS — I10 Essential (primary) hypertension: Secondary | ICD-10-CM

## 2021-11-23 ENCOUNTER — Ambulatory Visit: Payer: BC Managed Care – PPO

## 2022-02-02 ENCOUNTER — Telehealth: Payer: Self-pay | Admitting: *Deleted

## 2022-02-02 NOTE — Telephone Encounter (Signed)
LMTC to schedule Yearly Lung CA CT Scan. 

## 2022-02-09 ENCOUNTER — Other Ambulatory Visit: Payer: Self-pay | Admitting: *Deleted

## 2022-02-09 DIAGNOSIS — F17210 Nicotine dependence, cigarettes, uncomplicated: Secondary | ICD-10-CM

## 2022-02-09 DIAGNOSIS — Z87891 Personal history of nicotine dependence: Secondary | ICD-10-CM

## 2022-02-09 DIAGNOSIS — Z122 Encounter for screening for malignant neoplasm of respiratory organs: Secondary | ICD-10-CM

## 2022-02-14 ENCOUNTER — Other Ambulatory Visit: Payer: Self-pay

## 2022-02-14 ENCOUNTER — Ambulatory Visit
Admission: RE | Admit: 2022-02-14 | Discharge: 2022-02-14 | Disposition: A | Discharge status: Home / Self Care | Payer: BC Managed Care – PPO | Source: Ambulatory Visit | Attending: Acute Care | Admitting: Acute Care

## 2022-02-14 DIAGNOSIS — Z87891 Personal history of nicotine dependence: Secondary | ICD-10-CM | POA: Insufficient documentation

## 2022-02-14 DIAGNOSIS — F17210 Nicotine dependence, cigarettes, uncomplicated: Secondary | ICD-10-CM | POA: Diagnosis present

## 2022-02-14 DIAGNOSIS — Z122 Encounter for screening for malignant neoplasm of respiratory organs: Secondary | ICD-10-CM | POA: Diagnosis present

## 2022-02-16 ENCOUNTER — Other Ambulatory Visit: Payer: Self-pay | Admitting: Acute Care

## 2022-02-16 DIAGNOSIS — F17210 Nicotine dependence, cigarettes, uncomplicated: Secondary | ICD-10-CM

## 2022-02-16 DIAGNOSIS — Z87891 Personal history of nicotine dependence: Secondary | ICD-10-CM

## 2022-02-16 DIAGNOSIS — Z122 Encounter for screening for malignant neoplasm of respiratory organs: Secondary | ICD-10-CM

## 2022-06-12 ENCOUNTER — Encounter: Payer: BC Managed Care – PPO | Attending: Internal Medicine | Admitting: *Deleted

## 2022-06-12 ENCOUNTER — Encounter: Payer: Self-pay | Admitting: *Deleted

## 2022-06-12 VITALS — BP 120/76 | Ht 70.0 in | Wt 213.7 lb

## 2022-06-12 DIAGNOSIS — F17200 Nicotine dependence, unspecified, uncomplicated: Secondary | ICD-10-CM | POA: Diagnosis not present

## 2022-06-12 DIAGNOSIS — Z7984 Long term (current) use of oral hypoglycemic drugs: Secondary | ICD-10-CM | POA: Diagnosis not present

## 2022-06-12 DIAGNOSIS — Z79899 Other long term (current) drug therapy: Secondary | ICD-10-CM | POA: Insufficient documentation

## 2022-06-12 DIAGNOSIS — E118 Type 2 diabetes mellitus with unspecified complications: Secondary | ICD-10-CM | POA: Diagnosis present

## 2022-06-12 DIAGNOSIS — Z833 Family history of diabetes mellitus: Secondary | ICD-10-CM | POA: Diagnosis not present

## 2022-06-12 DIAGNOSIS — E1165 Type 2 diabetes mellitus with hyperglycemia: Secondary | ICD-10-CM

## 2022-06-12 DIAGNOSIS — Z713 Dietary counseling and surveillance: Secondary | ICD-10-CM | POA: Insufficient documentation

## 2022-06-12 NOTE — Patient Instructions (Signed)
Check blood sugars 2 x day before breakfast and 2 hrs after supper every day  Call your doctor for a prescription for:  1. Meter strips (type)  One Touch Verio  checking  2   times per day  2. Lancets (type)  One Touch Delica Plus checking  2   times per day  Exercise: Begin walking  for    15  minutes   3  days a week and gradually increase to 30 minutes 5 x week  Eat 3 meals day,   1-2  snacks a day Space meals 4-6 hours apart Don't skip meals - eat at least 1 protein and 1 carbohydrate serving  Quit smoking  Call back if you want to schedule an appointment with the nurse or the dietitian

## 2022-06-12 NOTE — Progress Notes (Unsigned)
Diabetes Self-Management Education  Visit Type: First/Initial  Appt. Start Time: 1450 Appt. End Time: 9024  06/12/2022  Mr. Gerald Villegas, identified by name and date of birth, is a 61 y.o. male with a diagnosis of Diabetes: Type 2.   ASSESSMENT  Blood pressure 120/76, height '5\' 10"'$  (1.778 m), weight 213 lb 11.2 oz (96.9 kg). Body mass index is 30.66 kg/m.   Diabetes Self-Management Education - 06/12/22 1615       Visit Information   Visit Type First/Initial      Initial Visit   Diabetes Type Type 2    Date Diagnosed 05/2007    Are you currently following a meal plan? Yes    What type of meal plan do you follow? "no carbs or very low carbs"    Are you taking your medications as prescribed? Yes      Health Coping   How would you rate your overall health? Fair      Psychosocial Assessment   Patient Belief/Attitude about Diabetes Defeat/Burnout    What is the hardest part about your diabetes right now, causing you the most concern, or is the most worrisome to you about your diabetes?   Taking/obtaining medications    Self-care barriers None    Self-management support Doctor's office;Family    Patient Concerns Nutrition/Meal planning;Medication;Monitoring;Healthy Lifestyle;Problem Solving;Glycemic Control;Weight Control    Special Needs None    Preferred Learning Style Auditory;Visual;Hands on    [place] in progress    How often do you need to have someone help you when you read instructions, pamphlets, or other written materials from your doctor or pharmacy? 1 - Never    What is the last grade level you completed in school? [place]      Pre-Education Assessment   Patient understands the diabetes disease and treatment process. Needs Review    Patient understands incorporating nutritional management into lifestyle. Needs Instruction    Patient undertands incorporating physical activity into lifestyle. Needs Instruction    Patient understands using  medications safely. Needs Review    Patient understands monitoring blood glucose, interpreting and using results Comprehends key points    Patient understands prevention, detection, and treatment of acute complications. Needs Instruction    Patient understands prevention, detection, and treatment of chronic complications. Demonstrates understanding / competency    Patient understands how to develop strategies to address psychosocial issues. Needs Review    Patient understands how to develop strategies to promote health/change behavior. Needs Review      Complications   Last HgB A1C per patient/outside source 8.3 %   05/14/22   How often do you check your blood sugar? 1-2 times/day    Fasting Blood glucose range (mg/dL) 70-129;130-179;180-200   96-183 mg/dL   Postprandial Blood glucose range (mg/dL) 130-179   2 pp's of 146 and 150   Have you had a dilated eye exam in the past 12 months? Yes    Have you had a dental exam in the past 12 months? Yes    Are you checking your feet? Yes    How many days per week are you checking your feet? 2      Dietary Intake   Breakfast skips    Snack (morning) protein bar, popcorn    Lunch onions, tomatoes, cuccumbers in vinegar; peanut butter on low carb wrap, ham wrap with little mayo, pork rinds    Dinner beef, chicken, pork, occasional fish; peas, beans, corrn, green beans, salads, likes many non-starchy  vegetables; nuggets with fruit cup on Fri from [place] (evening) peanuts, almonds    Beverage(s) water, tea sweetened with artificial sweetener, diet Mt Dew      Activity / Exercise   Activity / Exercise Type ADL's      Patient Education   Previous Diabetes Education Yes (please comment)   MD office and pharmacy in 2008   Disease Pathophysiology Definition of diabetes, type 1 and 2, and the diagnosis of diabetes;Factors that contribute to the development of diabetes;Explored patient's options for treatment of their diabetes    Healthy  Eating Role of diet in the treatment of diabetes and the relationship between the three main macronutrients and blood glucose level;Food label reading, portion sizes and measuring food.;Carbohydrate counting;Reviewed blood glucose goals for pre and post meals and how to evaluate the patients' food intake on their blood glucose level.;Meal timing in regards to the patients' current diabetes medication.    Being Active Role of exercise on diabetes management, blood pressure control and cardiac health.    Medications Reviewed patients medication for diabetes, action, purpose, timing of dose and side effects.    Monitoring Taught/evaluated SMBG meter.;Purpose and frequency of SMBG.;Taught/discussed recording of test results and interpretation of SMBG.;Identified appropriate SMBG and/or A1C goals.    Chronic complications Relationship between chronic complications and blood glucose control    Diabetes Stress and Support Identified and addressed patients feelings and concerns about diabetes;Role of stress on diabetes    Lifestyle and Health Coping Review risk of smoking and offered smoking cessation      Individualized Goals (developed by patient)   Reducing Risk Other (comment)   improve blood sugars, decrease medications, prevent diabetes complications, lose weight, lead a healthier lifestyle, quit smoking, become more fit     Outcomes   Expected Outcomes Demonstrated interest in learning. Expect positive outcomes    Program Status Not Completed        Individualized Plan for Diabetes Self-Management Training:   Learning Objective:  Patient will have a greater understanding of diabetes self-management. Patient education plan is to attend individual and/or group sessions per assessed needs and concerns.   Plan:   Patient Instructions  Check blood sugars 2 x day before breakfast and 2 hrs after supper every day  Call your doctor for a prescription for:  1. Meter strips (type)  One Touch  Verio  checking  2   times per day  2. Lancets (type)  One Touch Delica Plus checking  2   times per day  Exercise: Begin walking  for    15  minutes   3  days a week and gradually increase to 30 minutes 5 x week  Eat 3 meals day,   1-2  snacks a day Space meals 4-6 hours apart Don't skip meals - eat at least 1 protein and 1 carbohydrate serving  Quit smoking  Call back if you want to schedule an appointment with the nurse or the dietitian  Expected Outcomes:  Demonstrated interest in learning. Expect positive outcomes  Education material provided: General Meal Planning Guidelines Simple Meal Plan Meter = One Touch Verio Flex  If problems or questions, patient to contact team via:   Johny Drilling, RN, CCM, [place] [phone]  Future DSME appointment:  PRN

## 2023-01-19 ENCOUNTER — Other Ambulatory Visit: Payer: Self-pay | Admitting: Acute Care

## 2023-01-19 DIAGNOSIS — Z122 Encounter for screening for malignant neoplasm of respiratory organs: Secondary | ICD-10-CM

## 2023-01-19 DIAGNOSIS — Z87891 Personal history of nicotine dependence: Secondary | ICD-10-CM

## 2023-01-19 DIAGNOSIS — F17210 Nicotine dependence, cigarettes, uncomplicated: Secondary | ICD-10-CM

## 2023-02-18 ENCOUNTER — Ambulatory Visit
Admission: RE | Admit: 2023-02-18 | Discharge: 2023-02-18 | Disposition: A | Discharge status: Home / Self Care | Payer: BC Managed Care – PPO | Source: Ambulatory Visit | Attending: Internal Medicine | Admitting: Internal Medicine

## 2023-02-18 DIAGNOSIS — I2510 Atherosclerotic heart disease of native coronary artery without angina pectoris: Secondary | ICD-10-CM | POA: Diagnosis not present

## 2023-02-18 DIAGNOSIS — Z87891 Personal history of nicotine dependence: Secondary | ICD-10-CM

## 2023-02-18 DIAGNOSIS — J439 Emphysema, unspecified: Secondary | ICD-10-CM | POA: Insufficient documentation

## 2023-02-18 DIAGNOSIS — I700 Atherosclerosis of aorta: Secondary | ICD-10-CM | POA: Insufficient documentation

## 2023-02-18 DIAGNOSIS — Z122 Encounter for screening for malignant neoplasm of respiratory organs: Secondary | ICD-10-CM | POA: Diagnosis not present

## 2023-02-18 DIAGNOSIS — F17210 Nicotine dependence, cigarettes, uncomplicated: Secondary | ICD-10-CM | POA: Diagnosis not present

## 2023-02-20 ENCOUNTER — Other Ambulatory Visit: Payer: Self-pay

## 2023-02-20 DIAGNOSIS — Z87891 Personal history of nicotine dependence: Secondary | ICD-10-CM

## 2023-02-20 DIAGNOSIS — F17210 Nicotine dependence, cigarettes, uncomplicated: Secondary | ICD-10-CM

## 2024-03-16 ENCOUNTER — Encounter: Payer: Self-pay | Admitting: Otolaryngology

## 2024-04-30 ENCOUNTER — Other Ambulatory Visit: Payer: Self-pay | Admitting: Physician Assistant

## 2024-04-30 ENCOUNTER — Ambulatory Visit
Admission: RE | Admit: 2024-04-30 | Discharge: 2024-04-30 | Disposition: A | Discharge status: Home / Self Care | Source: Ambulatory Visit | Attending: Physician Assistant | Admitting: Physician Assistant

## 2024-04-30 DIAGNOSIS — R109 Unspecified abdominal pain: Secondary | ICD-10-CM | POA: Diagnosis present

## 2024-09-01 ENCOUNTER — Other Ambulatory Visit: Payer: Self-pay | Admitting: Internal Medicine

## 2024-09-01 DIAGNOSIS — F17210 Nicotine dependence, cigarettes, uncomplicated: Secondary | ICD-10-CM

## 2024-09-01 DIAGNOSIS — Z720 Tobacco use: Secondary | ICD-10-CM

## 2024-09-01 DIAGNOSIS — I10 Essential (primary) hypertension: Secondary | ICD-10-CM

## 2024-11-11 ENCOUNTER — Ambulatory Visit
Admission: RE | Admit: 2024-11-11 | Discharge: 2024-11-11 | Disposition: A | Discharge status: Home / Self Care | Source: Ambulatory Visit | Attending: Internal Medicine | Admitting: Internal Medicine

## 2024-11-11 DIAGNOSIS — I10 Essential (primary) hypertension: Secondary | ICD-10-CM | POA: Diagnosis present
